# Patient Record
Sex: Female | Born: 1955 | Race: White | Hispanic: No | Marital: Married | Smoking: Never smoker
Health system: Southern US, Community
[De-identification: ages and names within clinical notes are randomized; demographics above are authoritative.]

## PROBLEM LIST (undated history)

## (undated) DIAGNOSIS — F329 Major depressive disorder, single episode, unspecified: Secondary | ICD-10-CM

## (undated) DIAGNOSIS — F419 Anxiety disorder, unspecified: Secondary | ICD-10-CM

## (undated) DIAGNOSIS — R112 Nausea with vomiting, unspecified: Secondary | ICD-10-CM

## (undated) DIAGNOSIS — Z9889 Other specified postprocedural states: Secondary | ICD-10-CM

## (undated) DIAGNOSIS — M797 Fibromyalgia: Secondary | ICD-10-CM

## (undated) DIAGNOSIS — K219 Gastro-esophageal reflux disease without esophagitis: Secondary | ICD-10-CM

## (undated) DIAGNOSIS — F32A Depression, unspecified: Secondary | ICD-10-CM

## (undated) HISTORY — PX: COLPOSCOPY: SHX161

## (undated) HISTORY — PX: SINUS SURGERY WITH INSTATRAK: SHX5215

## (undated) HISTORY — PX: MANDIBLE RECONSTRUCTION: SHX431

## (undated) HISTORY — PX: TUBAL LIGATION: SHX77

---

## 1979-04-01 HISTORY — PX: ABDOMINAL HYSTERECTOMY: SHX81

## 1981-03-31 HISTORY — PX: MANDIBLE SURGERY: SHX707

## 2005-04-22 ENCOUNTER — Ambulatory Visit: Payer: Self-pay

## 2006-04-28 ENCOUNTER — Ambulatory Visit: Payer: Self-pay

## 2006-05-29 ENCOUNTER — Ambulatory Visit: Payer: Self-pay

## 2007-05-26 ENCOUNTER — Ambulatory Visit: Payer: Self-pay

## 2007-06-02 ENCOUNTER — Ambulatory Visit: Payer: Self-pay

## 2009-09-21 ENCOUNTER — Ambulatory Visit: Payer: Self-pay

## 2009-11-30 ENCOUNTER — Ambulatory Visit: Payer: Self-pay | Admitting: Gastroenterology

## 2011-09-14 ENCOUNTER — Ambulatory Visit (INDEPENDENT_AMBULATORY_CARE_PROVIDER_SITE_OTHER): Payer: 59 | Admitting: Emergency Medicine

## 2011-09-14 VITALS — BP 105/69 | HR 80 | Temp 98.1°F | Resp 16 | Ht 68.0 in | Wt 171.0 lb

## 2011-09-14 DIAGNOSIS — R5383 Other fatigue: Secondary | ICD-10-CM

## 2011-09-14 DIAGNOSIS — R5381 Other malaise: Secondary | ICD-10-CM

## 2011-09-14 LAB — POCT CBC
HCT, POC: 38.4 % (ref 37.7–47.9)
Hemoglobin: 11.9 g/dL — AB (ref 12.2–16.2)
MCH, POC: 25.8 pg — AB (ref 27–31.2)
MCV: 83.2 fL (ref 80–97)
MID (cbc): 0.3 (ref 0–0.9)
RBC: 4.61 M/uL (ref 4.04–5.48)
WBC: 5.6 10*3/uL (ref 4.6–10.2)

## 2011-09-14 LAB — COMPREHENSIVE METABOLIC PANEL
ALT: 15 U/L (ref 0–35)
Albumin: 4.6 g/dL (ref 3.5–5.2)
CO2: 30 mEq/L (ref 19–32)
Chloride: 107 mEq/L (ref 96–112)
Glucose, Bld: 87 mg/dL (ref 70–99)
Potassium: 4.9 mEq/L (ref 3.5–5.3)
Sodium: 144 mEq/L (ref 135–145)
Total Protein: 7.1 g/dL (ref 6.0–8.3)

## 2011-09-14 LAB — VITAMIN B12: Vitamin B-12: 461 pg/mL (ref 211–911)

## 2011-09-14 NOTE — Progress Notes (Signed)
  Subjective:    Patient ID: Deborah Walsh, female    DOB: 07/29/1955, 56 y.o.   MRN: [id number]  HPI Comments: The patient is experiencing extreme fatigue for 6 weeks to the point that she feels a need to go to bed when she arrives home from work.Says that she sleeps well and is uninterrupted in her sleep.  Denies acute depression and her symptoms have been under control with zoloft.  No constitutional symptoms or symptoms acute illness.  Says mother has hypothyroidism and she is routinely checked for TSH     Review of Systems  Constitutional: Positive for activity change and fatigue. Negative for fever, chills, diaphoresis, appetite change and unexpected weight change.  HENT: Negative.   Eyes: Negative.   Respiratory: Negative.   Cardiovascular: Negative.   Gastrointestinal: Negative.   Genitourinary: Negative.   Musculoskeletal: Negative.   Neurological: Negative.   Hematological: Negative.   Psychiatric/Behavioral: Negative.        Objective:   Physical Exam  Constitutional: She is oriented to person, place, and time. She appears well-developed and well-nourished.  HENT:  Head: Normocephalic and atraumatic.  Right Ear: External ear normal.  Left Ear: External ear normal.  Eyes: EOM are normal. Pupils are equal, round, and reactive to light. No scleral icterus.  Neck: Normal range of motion. Neck supple.  Cardiovascular: Normal rate, regular rhythm and normal heart sounds.   Pulmonary/Chest: Effort normal and breath sounds normal. She has no wheezes. She has no rales.  Abdominal: Soft. She exhibits no mass. There is no tenderness. There is no rebound and no guarding.  Musculoskeletal: Normal range of motion. She exhibits no edema.  Neurological: She is alert and oriented to person, place, and time. She has normal reflexes.  Skin: Skin is warm and dry.  Psychiatric: She has a normal mood and affect. Her behavior is normal. Judgment and thought content normal.            Assessment & Plan:  Had colonoscopy within past 18 months and was normal.  Will check labs and follow up with patient.

## 2011-09-15 LAB — VITAMIN D 25 HYDROXY (VIT D DEFICIENCY, FRACTURES): Vit D, 25-Hydroxy: 53 ng/mL (ref 30–89)

## 2011-09-17 ENCOUNTER — Ambulatory Visit (INDEPENDENT_AMBULATORY_CARE_PROVIDER_SITE_OTHER): Payer: 59 | Admitting: Family Medicine

## 2011-09-17 ENCOUNTER — Encounter: Payer: Self-pay | Admitting: Radiology

## 2011-09-17 VITALS — BP 130/78 | HR 64 | Temp 97.9°F | Resp 17 | Ht 67.0 in | Wt 173.0 lb

## 2011-09-17 DIAGNOSIS — R5383 Other fatigue: Secondary | ICD-10-CM

## 2011-09-17 DIAGNOSIS — R5381 Other malaise: Secondary | ICD-10-CM

## 2011-09-17 DIAGNOSIS — D649 Anemia, unspecified: Secondary | ICD-10-CM

## 2011-09-17 NOTE — Progress Notes (Signed)
  Subjective:    Patient ID: Deborah Walsh, female    DOB: [birth year], 56 y.o.   MRN: [id number]  HPI patient was told to come back in for a B12 shot, did her B12 level was low. I reviewed all labs with her, and I explained that I do not think that the B12 level is in the low, that this was an error. The B12 level its to me like it is about mid normal range. She does have a mild microcytic anemia, and needs to have an iron level checked. She has had a colonoscopy 1-1/2 years ago which was normal. Her fatigue started about 6 weeks ago. I went through a review of systems again there, and she denies any other complaints. She doesn't know social stresses that might have triggered this. She works at [employer].  She expresses she is very upset about having had to come in and wake when this was not an abnormal lab.    Review of Systems Review of systems done as noted above    Objective:   Physical Exam        Assessment & Plan:  Fatigue Microcytic anemia  Plan: I explained to her that we would not be charging her for today. I am checking her iron. She left very upset about the day.

## 2011-09-27 ENCOUNTER — Telehealth: Payer: Self-pay

## 2011-09-27 NOTE — Telephone Encounter (Signed)
Pt wanted to find to get test results for added on test.

## 2011-09-28 NOTE — Telephone Encounter (Signed)
I called [facility] and spoke with Mindy to find out results on pt's blood. In our system showed as in process. She stated that it was their fault, that they do have the message of where Deborah Walsh called and added on the test, but the test did not get passed to the lab at [facility] and blood has been disposed of.

## 2011-09-29 ENCOUNTER — Telehealth: Payer: Self-pay

## 2011-09-29 NOTE — Telephone Encounter (Signed)
Spoke with pt advised blood was not checked for Iron/TIBC like we wanted [facility] to do. Pt very upset and would like [place] to write a script for Iron/TIBC and mail it to her to take to [employer] to get it done there because she works there. Please write, she wants it mailed

## 2011-09-29 NOTE — Telephone Encounter (Signed)
Done and up front 

## 2011-09-30 NOTE — Telephone Encounter (Signed)
Pt notified and wanted it mailed to her. I am mailing it now.

## 2011-10-02 NOTE — Telephone Encounter (Signed)
The notes are noted.  She has been sent a slip to get iron checked.  Not much else to be done at this point as far as I can tell.

## 2011-12-04 ENCOUNTER — Encounter: Payer: Self-pay | Admitting: Family Medicine

## 2013-03-27 ENCOUNTER — Other Ambulatory Visit: Payer: Self-pay | Admitting: Physician Assistant

## 2013-03-27 MED ORDER — OSELTAMIVIR PHOSPHATE 75 MG PO CAPS: 75.0000 mg | ORAL_CAPSULE | Freq: Two times a day (BID) | ORAL | Status: DC

## 2013-12-27 DIAGNOSIS — F419 Anxiety disorder, unspecified: Secondary | ICD-10-CM | POA: Insufficient documentation

## 2013-12-27 DIAGNOSIS — F32A Depression, unspecified: Secondary | ICD-10-CM | POA: Insufficient documentation

## 2013-12-27 DIAGNOSIS — F329 Major depressive disorder, single episode, unspecified: Secondary | ICD-10-CM | POA: Insufficient documentation

## 2014-01-26 ENCOUNTER — Ambulatory Visit: Payer: Self-pay | Admitting: Family Medicine

## 2015-04-09 ENCOUNTER — Emergency Department
Admission: EM | Admit: 2015-04-09 | Discharge: 2015-04-09 | Disposition: A | Discharge status: Home / Self Care | Payer: 59 | Attending: Emergency Medicine | Admitting: Emergency Medicine

## 2015-04-09 ENCOUNTER — Encounter: Payer: Self-pay | Admitting: Emergency Medicine

## 2015-04-09 DIAGNOSIS — Z79899 Other long term (current) drug therapy: Secondary | ICD-10-CM | POA: Diagnosis not present

## 2015-04-09 DIAGNOSIS — M79604 Pain in right leg: Secondary | ICD-10-CM | POA: Diagnosis present

## 2015-04-09 DIAGNOSIS — M5431 Sciatica, right side: Secondary | ICD-10-CM

## 2015-04-09 HISTORY — DX: Anxiety disorder, unspecified: F41.9

## 2015-04-09 HISTORY — DX: Fibromyalgia: M79.7

## 2015-04-09 MED ORDER — METHOCARBAMOL 750 MG PO TABS: 1500.0000 mg | ORAL_TABLET | Freq: Four times a day (QID) | ORAL | Status: DC

## 2015-04-09 MED ORDER — METHOCARBAMOL 500 MG PO TABS
1000.0000 mg | ORAL_TABLET | Freq: Once | ORAL | Status: AC
  Administered 2015-04-09: 1000 mg via ORAL
  Filled 2015-04-09: qty 2

## 2015-04-09 MED ORDER — OXYCODONE-ACETAMINOPHEN 5-325 MG PO TABS: 1.0000 | ORAL_TABLET | Freq: Four times a day (QID) | ORAL | Status: DC | PRN

## 2015-04-09 MED ORDER — DEXAMETHASONE SODIUM PHOSPHATE 10 MG/ML IJ SOLN
10.0000 mg | Freq: Once | INTRAMUSCULAR | Status: AC
  Administered 2015-04-09: 10 mg via INTRAMUSCULAR
  Filled 2015-04-09: qty 1

## 2015-04-09 MED ORDER — METHYLPREDNISOLONE 4 MG PO TBPK: ORAL_TABLET | ORAL | Status: DC

## 2015-04-09 MED ORDER — HYDROMORPHONE HCL 1 MG/ML IJ SOLN
1.0000 mg | Freq: Once | INTRAMUSCULAR | Status: AC
  Administered 2015-04-09: 1 mg via INTRAMUSCULAR
  Filled 2015-04-09: qty 1

## 2015-04-09 NOTE — ED Notes (Signed)
States she has a hx of sciatica developed pain to lower back which is radiating into  Right leg   Pain started on Friday and is worse today

## 2015-04-09 NOTE — ED Notes (Signed)
Pt to ed with c/o right leg pain and lower back pain x several months,  States last week much worse, denies fall or injury.

## 2015-04-09 NOTE — ED Provider Notes (Signed)
[facility] Emergency Department Provider Note  ____________________________________________  Time seen: Approximately 10:53 AM  I have reviewed the triage vital signs and the nursing notes.   HISTORY  Chief Complaint Leg Pain    HPI Deborah Walsh is a 60 y.o. female patient complaining of 3 days of sciatica affecting the right lower extremity. Patient states she talked to her doctor who prescribed Mobic telephonically.Patient stated this is not relieved her complaint. Patient denies any bladder or bowel dysfunction. Patient rates the pain to 7/10. Patient describes the pain as a radiating sensation resulting in numbness to the right lower extremity. No other palliative measures taken for this complaint.   Past Medical History  Diagnosis Date  . Anxiety   . Fibromyalgia     There are no active problems to display for this patient.   History reviewed. No pertinent past surgical history.  Current Outpatient Rx  Name  Route  Sig  Dispense  Refill  . oseltamivir (TAMIFLU) 75 MG capsule   Oral   Take 1 capsule (75 mg total) by mouth 2 (two) times daily.   10 capsule   0   . sertraline (ZOLOFT) 50 MG tablet   Oral   Take 50 mg by mouth daily.           Allergies Sulfa antibiotics  History reviewed. No pertinent family history.  Social History Social History  Substance Use Topics  . Smoking status: Never Smoker   . Smokeless tobacco: None  . Alcohol Use: No    Review of Systems Constitutional: No fever/chills Eyes: No visual changes. ENT: No sore throat. Cardiovascular: Denies chest pain. Respiratory: Denies shortness of breath. Gastrointestinal: No abdominal pain.  No nausea, no vomiting.  No diarrhea.  No constipation. Genitourinary: Negative for dysuria. Musculoskeletal: Negative for back pain. Skin: Negative for rash. Neurological: Negative for headaches, focal weakness or numbness. Allergic/Immunilogical: Sulfa antibiotics   10-point ROS otherwise negative.  ____________________________________________   PHYSICAL EXAM:  VITAL SIGNS: ED Triage Vitals  Enc Vitals Group     BP 04/09/15 1002 136/80 mmHg     Pulse Rate 04/09/15 1002 87     Resp --      Temp 04/09/15 1002 98 F (36.7 C)     Temp Source 04/09/15 1002 Oral     SpO2 04/09/15 1002 100 %     Weight 04/09/15 1018 173 lb (78.472 kg)     Height 04/09/15 1018 5\' 8"  (1.727 m)     Head Cir --      Peak Flow --      Pain Score 04/09/15 0947 7     Pain Loc --      Pain Edu? --      Excl. in GC? --     Constitutional: Alert and oriented. Well appearing and in no acute distress. Eyes: Conjunctivae are normal. PERRL. EOMI. Head: Atraumatic. Nose: No congestion/rhinnorhea. Mouth/Throat: Mucous membranes are moist.  Oropharynx non-erythematous. Neck: No stridor. No cervical spine tenderness to palpation. Hematological/Lymphatic/Immunilogical: No cervical lymphadenopathy. Cardiovascular: Normal rate, regular rhythm. Grossly normal heart sounds.  Good peripheral circulation. Respiratory: Normal respiratory effort.  No retractions. Lungs CTAB. Gastrointestinal: Soft and nontender. No distention. No abdominal bruits. No CVA tenderness. Musculoskeletal: No lower extremity tenderness nor edema.  No joint effusions. Neurologic:  Normal speech and language. No gross focal neurologic deficits are appreciated. No gait instability. Skin:  Skin is warm, dry and intact. No rash noted. Psychiatric: Mood and affect are normal.  Speech and behavior are normal.  ____________________________________________   LABS (all labs ordered are listed, but only abnormal results are displayed)  Labs Reviewed - No data to display ____________________________________________  EKG   ____________________________________________  RADIOLOGY   ____________________________________________   PROCEDURES  Procedure(s) performed: None  Critical Care performed:  No  ____________________________________________   INITIAL IMPRESSION / ASSESSMENT AND PLAN / ED COURSE  Pertinent labs & imaging results that were available during my care of the patient were reviewed by me and considered in my medical decision making (see chart for details).  Sciatica involving the right lower extremity. Patient given discharge instructions and advised follow-up family doctor in 2-3 days. Return to ER if condition worsens. ____________________________________________   FINAL CLINICAL IMPRESSION(S) / ED DIAGNOSES  Final diagnoses:  Sciatica of right side      Joni Reining, PA-C 04/09/15 1109  Emily Filbert, MD 04/09/15 [phone]

## 2017-01-08 ENCOUNTER — Ambulatory Visit: Payer: Self-pay | Admitting: Advanced Practice Midwife

## 2017-01-19 ENCOUNTER — Ambulatory Visit (INDEPENDENT_AMBULATORY_CARE_PROVIDER_SITE_OTHER): Payer: 59 | Admitting: Advanced Practice Midwife

## 2017-01-19 ENCOUNTER — Encounter: Payer: Self-pay | Admitting: Advanced Practice Midwife

## 2017-01-19 VITALS — BP 120/80 | HR 83 | Ht 67.0 in | Wt 185.0 lb

## 2017-01-19 DIAGNOSIS — Z124 Encounter for screening for malignant neoplasm of cervix: Secondary | ICD-10-CM | POA: Diagnosis not present

## 2017-01-19 DIAGNOSIS — Z01419 Encounter for gynecological examination (general) (routine) without abnormal findings: Secondary | ICD-10-CM

## 2017-01-19 NOTE — Patient Instructions (Signed)
Preventive Care 40-64 Years, Female Preventive care refers to lifestyle choices and visits with your health care provider that can promote health and wellness. What does preventive care include?  A yearly physical exam. This is also called an annual well check.  Dental exams once or twice a year.  Routine eye exams. Ask your health care provider how often you should have your eyes checked.  Personal lifestyle choices, including: ? Daily care of your teeth and gums. ? Regular physical activity. ? Eating a healthy diet. ? Avoiding tobacco and drug use. ? Limiting alcohol use. ? Practicing safe sex. ? Taking low-dose aspirin daily starting at age 58. ? Taking vitamin and mineral supplements as recommended by your health care provider. What happens during an annual well check? The services and screenings done by your health care provider during your annual well check will depend on your age, overall health, lifestyle risk factors, and family history of disease. Counseling Your health care provider may ask you questions about your:  Alcohol use.  Tobacco use.  Drug use.  Emotional well-being.  Home and relationship well-being.  Sexual activity.  Eating habits.  Work and work Statistician.  Method of birth control.  Menstrual cycle.  Pregnancy history.  Screening You may have the following tests or measurements:  Height, weight, and BMI.  Blood pressure.  Lipid and cholesterol levels. These may be checked every 5 years, or more frequently if you are over 81 years old.  Skin check.  Lung cancer screening. You may have this screening every year starting at age 78 if you have a 30-pack-year history of smoking and currently smoke or have quit within the past 15 years.  Fecal occult blood test (FOBT) of the stool. You may have this test every year starting at age 65.  Flexible sigmoidoscopy or colonoscopy. You may have a sigmoidoscopy every 5 years or a colonoscopy  every 10 years starting at age 30.  Hepatitis C blood test.  Hepatitis B blood test.  Sexually transmitted disease (STD) testing.  Diabetes screening. This is done by checking your blood sugar (glucose) after you have not eaten for a while (fasting). You may have this done every 1-3 years.  Mammogram. This may be done every 1-2 years. Talk to your health care provider about when you should start having regular mammograms. This may depend on whether you have a family history of breast cancer.  BRCA-related cancer screening. This may be done if you have a family history of breast, ovarian, tubal, or peritoneal cancers.  Pelvic exam and Pap test. This may be done every 3 years starting at age 80. Starting at age 36, this may be done every 5 years if you have a Pap test in combination with an HPV test.  Bone density scan. This is done to screen for osteoporosis. You may have this scan if you are at high risk for osteoporosis.  Discuss your test results, treatment options, and if necessary, the need for more tests with your health care provider. Vaccines Your health care provider may recommend certain vaccines, such as:  Influenza vaccine. This is recommended every year.  Tetanus, diphtheria, and acellular pertussis (Tdap, Td) vaccine. You may need a Td booster every 10 years.  Varicella vaccine. You may need this if you have not been vaccinated.  Zoster vaccine. You may need this after age 5.  Measles, mumps, and rubella (MMR) vaccine. You may need at least one dose of MMR if you were born in  1957 or later. You may also need a second dose.  Pneumococcal 13-valent conjugate (PCV13) vaccine. You may need this if you have certain conditions and were not previously vaccinated.  Pneumococcal polysaccharide (PPSV23) vaccine. You may need one or two doses if you smoke cigarettes or if you have certain conditions.  Meningococcal vaccine. You may need this if you have certain  conditions.  Hepatitis A vaccine. You may need this if you have certain conditions or if you travel or work in places where you may be exposed to hepatitis A.  Hepatitis B vaccine. You may need this if you have certain conditions or if you travel or work in places where you may be exposed to hepatitis B.  Haemophilus influenzae type b (Hib) vaccine. You may need this if you have certain conditions.  Talk to your health care provider about which screenings and vaccines you need and how often you need them. This information is not intended to replace advice given to you by your health care provider. Make sure you discuss any questions you have with your health care provider. Document Released: 04/13/2015 Document Revised: 12/05/2015 Document Reviewed: 01/16/2015 Elsevier Interactive Patient Education  2017 [employer].

## 2017-01-19 NOTE — Progress Notes (Signed)
Patient ID: Deborah Walsh, female   DOB: [birth year], 61 y.o.   MRN: [id number]     Gynecology Annual Exam  PCP: Towana Badger, MD  Chief Complaint:  Chief Complaint  Patient presents with  . Gynecologic Exam    History of Present Illness:Patient is a 61 y.o. G2P1011 presents for annual exam. The patient has no complaints today.   LMP: No LMP recorded. Patient has had a hysterectomy. Patient is postmenopausal. Menarche:not applicable Postcoital Bleeding: no Dysmenorrhea: not applicable   The patient is sexually active. She denies dyspareunia.  The patient does not perform self breast exams.  There is no notable family history of breast or ovarian cancer in her family.  The patient wears seatbelts: yes.   The patient has regular exercise: yes.    The patient denies current symptoms of depression.  She admits to correct dosage of, and management of her antidepressant.  Review of Systems: Review of Systems  Constitutional: Negative.   HENT: Negative.   Eyes: Negative.   Respiratory: Negative.   Cardiovascular: Negative.   Gastrointestinal: Negative.   Genitourinary: Negative.   Musculoskeletal: Negative.   Skin: Negative.   Neurological: Negative.   Endo/Heme/Allergies: Negative.   Psychiatric/Behavioral: Negative.     Past Medical History:  Past Medical History:  Diagnosis Date  . Anxiety   . Fibromyalgia     Past Surgical History:  Past Surgical History:  Procedure Laterality Date  . ABDOMINAL HYSTERECTOMY    . COLPOSCOPY    . SINUS SURGERY WITH INSTATRAK    . TUBAL LIGATION      Gynecologic History:  No LMP recorded. Patient has had a hysterectomy. Last Pap: 2 years ago Results were:  ASCUS with NEGATIVE high risk HPV  Last mammogram: 2 years ago Results were: BI-RAD I,  She has had a normal Bone Density Scan in the past Obstetric History: G2P1011  Family History:  Family History  Problem Relation Age of Onset  . Cervical cancer Mother   .  Diabetes Mother   . Hypertension Mother   . Hypothyroidism Mother   . Kidney failure Mother   . Hypertension Father   . Liver cancer Father   . Lung cancer Father   . Parkinson's disease Father     Social History:  Social History   Social History  . Marital status: Married    Spouse name: N/A  . Number of children: N/A  . Years of education: N/A   Occupational History  . Not on file.   Social History Main Topics  . Smoking status: Never Smoker  . Smokeless tobacco: Never Used  . Alcohol use No  . Drug use: No  . Sexual activity: Yes   Other Topics Concern  . Not on file   Social History Narrative  . No narrative on file    Allergies:  Allergies  Allergen Reactions  . Sulfa Antibiotics     Medications: Prior to Admission medications   Medication Sig Start Date End Date Taking? Authorizing Provider  nortriptyline (PAMELOR) 50 MG capsule  11/18/16   [provider]  sertraline (ZOLOFT) 50 MG tablet Take 50 mg by mouth daily.    [provider]    Physical Exam Vitals: Blood pressure 120/80, pulse 83, height 5\' 7"  (1.702 m), weight 185 lb (83.9 kg).  General: NAD HEENT: normocephalic, anicteric Thyroid: no enlargement, no palpable nodules Pulmonary: No increased work of breathing, CTAB Cardiovascular: RRR, distal pulses 2+ Breast: Breast symmetrical, no  tenderness, no palpable nodules or masses, no skin or nipple retraction present, no nipple discharge.  No axillary or supraclavicular lymphadenopathy. Abdomen: NABS, soft, non-tender, non-distended.  Umbilicus without lesions.  No hepatomegaly, splenomegaly or masses palpable. No evidence of hernia  Genitourinary:  External: Normal external female genitalia.  Normal urethral meatus, normal  Bartholin's and Skene's glands.    Vagina: Normal vaginal mucosa, no evidence of prolapse.    Cervix: s/p hysterectomy  Uterus: s/p hysterectomy  Adnexa: ovaries non-enlarged, no adnexal masses  Rectal:  deferred  Lymphatic: no evidence of inguinal lymphadenopathy Extremities: no edema, erythema, or tenderness Neurologic: Grossly intact Psychiatric: mood appropriate, affect full    Assessment: 61 y.o. G2P1011 routine annual exam  Plan: Problem List Items Addressed This Visit    None    Visit Diagnoses    Well woman exam with routine gynecological exam    -  Primary   Relevant Orders   IGP, Aptima HPV   MM DIGITAL SCREENING BILATERAL   DG Bone Density   Cervical cancer screening       Relevant Orders   IGP, Aptima HPV      1) Mammogram - recommend yearly screening mammogram.  Mammogram Was ordered today  2) STI screening was offered and declined  3) ASCCP guidelines and rational discussed.  Patient opts for determining screening interval following results of today's PAP  4) Osteoporosis  - per USPTF routine screening DEXA at age 63, Scan ordered today   Consider FDA-approved medical therapies in postmenopausal women and men aged 63 years and older, based on the following: a) A hip or vertebral (clinical or morphometric) fracture b) T-score ? -2.5 at the femoral neck or spine after appropriate evaluation to exclude secondary causes C) Low bone mass (T-score between -1.0 and -2.5 at the femoral neck or spine) and a 10-year probability of a hip fracture ? 3% or a 10-year probability of a major osteoporosis-related fracture ? 20% based on the [place]-adapted WHO algorithm   5) Routine healthcare maintenance including cholesterol, diabetes screening discussed managed by PCP  6) Colonoscopy: managed by PCP.  Screening recommended starting at age 63 for average risk individuals, age 640 for individuals deemed at increased risk (including African Americans) and recommended to continue until age 175.  For patient age 63-85 individualized approach is recommended.  Gold standard screening is via colonoscopy, Cologuard screening is an acceptable alternative for patient unwilling or unable to  undergo colonoscopy.  "Colorectal cancer screening for average?risk adults: 2018 guideline update from the American Cancer Society"CA: A Cancer Journal for Clinicians: Aug 27, 2016   7) Follow up 1 year for routine annual  Tresea MallJane Scheryl Sanborn, [place]

## 2017-01-21 LAB — IGP, APTIMA HPV
HPV APTIMA: NEGATIVE
PAP SMEAR COMMENT: 0

## 2017-03-11 ENCOUNTER — Other Ambulatory Visit: Payer: Self-pay | Admitting: Physical Medicine and Rehabilitation

## 2017-03-11 DIAGNOSIS — M5412 Radiculopathy, cervical region: Secondary | ICD-10-CM

## 2017-03-18 ENCOUNTER — Ambulatory Visit: Payer: 59

## 2017-03-25 ENCOUNTER — Ambulatory Visit
Admission: RE | Admit: 2017-03-25 | Discharge: 2017-03-25 | Disposition: A | Discharge status: Home / Self Care | Payer: 59 | Source: Ambulatory Visit | Attending: Physical Medicine and Rehabilitation | Admitting: Physical Medicine and Rehabilitation

## 2017-03-25 DIAGNOSIS — M5412 Radiculopathy, cervical region: Secondary | ICD-10-CM | POA: Diagnosis present

## 2017-03-25 DIAGNOSIS — M47814 Spondylosis without myelopathy or radiculopathy, thoracic region: Secondary | ICD-10-CM | POA: Insufficient documentation

## 2017-03-25 DIAGNOSIS — M4322 Fusion of spine, cervical region: Secondary | ICD-10-CM | POA: Diagnosis not present

## 2017-04-22 ENCOUNTER — Other Ambulatory Visit: Payer: 59

## 2017-07-06 ENCOUNTER — Ambulatory Visit
Admission: RE | Admit: 2017-07-06 | Discharge: 2017-07-06 | Disposition: A | Discharge status: Home / Self Care | Payer: Managed Care, Other (non HMO) | Source: Ambulatory Visit | Attending: Advanced Practice Midwife | Admitting: Advanced Practice Midwife

## 2017-07-06 DIAGNOSIS — M8589 Other specified disorders of bone density and structure, multiple sites: Secondary | ICD-10-CM | POA: Insufficient documentation

## 2017-07-06 DIAGNOSIS — Z1231 Encounter for screening mammogram for malignant neoplasm of breast: Secondary | ICD-10-CM | POA: Insufficient documentation

## 2017-07-06 DIAGNOSIS — Z01419 Encounter for gynecological examination (general) (routine) without abnormal findings: Secondary | ICD-10-CM

## 2017-07-07 ENCOUNTER — Other Ambulatory Visit (HOSPITAL_COMMUNITY): Payer: Self-pay | Admitting: Orthopedic Surgery

## 2017-07-07 ENCOUNTER — Other Ambulatory Visit: Payer: Self-pay | Admitting: Orthopedic Surgery

## 2017-07-07 DIAGNOSIS — M25511 Pain in right shoulder: Secondary | ICD-10-CM

## 2017-07-14 ENCOUNTER — Ambulatory Visit: Payer: Managed Care, Other (non HMO)

## 2017-07-14 ENCOUNTER — Ambulatory Visit
Admission: RE | Admit: 2017-07-14 | Discharge: 2017-07-14 | Disposition: A | Discharge status: Home / Self Care | Payer: Managed Care, Other (non HMO) | Source: Ambulatory Visit | Attending: Orthopedic Surgery | Admitting: Orthopedic Surgery

## 2017-07-14 DIAGNOSIS — M25511 Pain in right shoulder: Secondary | ICD-10-CM

## 2017-07-14 DIAGNOSIS — M19011 Primary osteoarthritis, right shoulder: Secondary | ICD-10-CM | POA: Diagnosis not present

## 2017-08-04 ENCOUNTER — Other Ambulatory Visit: Payer: Self-pay

## 2017-08-04 ENCOUNTER — Encounter
Admission: RE | Admit: 2017-08-04 | Discharge: 2017-08-04 | Disposition: A | Discharge status: Home / Self Care | Payer: Managed Care, Other (non HMO) | Source: Ambulatory Visit | Attending: Orthopedic Surgery | Admitting: Orthopedic Surgery

## 2017-08-04 ENCOUNTER — Inpatient Hospital Stay: Admission: RE | Admit: 2017-08-04 | Payer: Managed Care, Other (non HMO) | Source: Ambulatory Visit

## 2017-08-04 HISTORY — DX: Gastro-esophageal reflux disease without esophagitis: K21.9

## 2017-08-04 HISTORY — DX: Major depressive disorder, single episode, unspecified: F32.9

## 2017-08-04 HISTORY — DX: Depression, unspecified: F32.A

## 2017-08-04 HISTORY — DX: Other specified postprocedural states: Z98.890

## 2017-08-04 HISTORY — DX: Nausea with vomiting, unspecified: R11.2

## 2017-08-04 LAB — CBC
HCT: 31.3 % — ABNORMAL LOW (ref 35.0–47.0)
HEMOGLOBIN: 10.5 g/dL — AB (ref 12.0–16.0)
MCH: 26.7 pg (ref 26.0–34.0)
MCHC: 33.6 g/dL (ref 32.0–36.0)
MCV: 79.5 fL — ABNORMAL LOW (ref 80.0–100.0)
Platelets: 296 10*3/uL (ref 150–440)
RBC: 3.94 MIL/uL (ref 3.80–5.20)
RDW: 15.9 % — AB (ref 11.5–14.5)
WBC: 6.1 10*3/uL (ref 3.6–11.0)

## 2017-08-04 LAB — URINALYSIS, ROUTINE W REFLEX MICROSCOPIC
Bilirubin Urine: NEGATIVE
GLUCOSE, UA: NEGATIVE mg/dL
HGB URINE DIPSTICK: NEGATIVE
KETONES UR: NEGATIVE mg/dL
LEUKOCYTES UA: NEGATIVE
Nitrite: NEGATIVE
PH: 7 (ref 5.0–8.0)
Protein, ur: NEGATIVE mg/dL
Specific Gravity, Urine: 1.008 (ref 1.005–1.030)

## 2017-08-04 LAB — BASIC METABOLIC PANEL
Anion gap: 7 (ref 5–15)
BUN: 16 mg/dL (ref 6–20)
CALCIUM: 8.8 mg/dL — AB (ref 8.9–10.3)
CHLORIDE: 102 mmol/L (ref 101–111)
CO2: 27 mmol/L (ref 22–32)
CREATININE: 1.03 mg/dL — AB (ref 0.44–1.00)
GFR calc Af Amer: >60 mL/min (ref >60)
GFR calc non Af Amer: 57 mL/min — ABNORMAL LOW (ref >60)
Glucose, Bld: 90 mg/dL (ref 65–99)
Potassium: 4.1 mmol/L (ref 3.5–5.1)
SODIUM: 136 mmol/L (ref 135–145)

## 2017-08-04 LAB — TYPE AND SCREEN
ABO/RH(D): A POS
Antibody Screen: NEGATIVE

## 2017-08-04 LAB — SURGICAL PCR SCREEN
MRSA, PCR: NEGATIVE
Staphylococcus aureus: NEGATIVE

## 2017-08-04 LAB — PROTIME-INR
INR: 0.92
PROTHROMBIN TIME: 12.3 s (ref 11.4–15.2)

## 2017-08-04 NOTE — Patient Instructions (Signed)
Your procedure is scheduled on:  Friday, Aug 07, 2017 Report to Day Surgery on the 2nd floor of the [facility] To find out your arrival time, please call [phone] between 1PM - 3PM on: Thursday, Aug 06, 2017  REMEMBER: Instructions that are not followed completely may result in serious medical risk, up to and including death; or upon the discretion of your surgeon and anesthesiologist your surgery may need to be rescheduled.  Do not eat food after midnight the night before your procedure.  No gum chewing, lozengers or hard candies.  You may however, drink CLEAR liquids up to 2 hours before you are scheduled to arrive for your surgery. Do not drink anything within 2 hours of the start of your surgery.  Clear liquids include: - water  - apple juice without pulp - clear gatorade - black coffee or tea (Do NOT add anything to the coffee or tea) Do NOT drink anything that is not on this list.  No Alcohol for 24 hours before or after surgery.  No Smoking including e-cigarettes for 24 hours prior to surgery.  No chewable tobacco products for at least 6 hours prior to surgery.  No nicotine patches on the day of surgery.  On the morning of surgery brush your teeth with toothpaste and water, you may rinse your mouth with mouthwash if you wish. Do not swallow any toothpaste or mouthwash.  Notify your doctor if there is any change in your medical condition (cold, fever, infection).  Do not wear jewelry, make-up, hairpins, clips or nail polish.  Do not wear lotions, powders, or perfumes. You may wear deodorant.  Do not shave 48 hours prior to surgery.   Contacts and dentures may not be worn into surgery.  Do not bring valuables to the hospital, including drivers license, insurance or credit cards.  [facility] is not responsible for any belongings or valuables.   TAKE THESE MEDICATIONS THE MORNING OF SURGERY:  1.  zoloft  Use CHG Soap as directed on instruction sheet.  NOW!   Stop Anti-inflammatories (NSAIDS) such as Advil, Aleve, Ibuprofen, Motrin, Naproxen, Naprosyn and Aspirin based products such as Excedrin, Goodys Powder, BC Powder. (May take Tylenol or Acetaminophen if needed.)  NOW!  Stop ANY OVER THE COUNTER supplements until after surgery. (GLUCOSAMINE)  Wear comfortable clothing (specific to your surgery type) to the hospital.  Plan for stool softeners for home use.  If you are being admitted to the hospital overnight, leave your suitcase in the car. After surgery it may be brought to your room.  Please call [phone] if you have any questions about these instructions.

## 2017-08-05 LAB — URINE CULTURE

## 2017-08-06 MED ORDER — CEFAZOLIN SODIUM-DEXTROSE 2-4 GM/100ML-% IV SOLN
2.0000 g | Freq: Once | INTRAVENOUS | Status: AC
  Administered 2017-08-07 (×2): 2 g via INTRAVENOUS

## 2017-08-07 ENCOUNTER — Other Ambulatory Visit: Payer: Self-pay

## 2017-08-07 ENCOUNTER — Inpatient Hospital Stay: Payer: Managed Care, Other (non HMO)

## 2017-08-07 ENCOUNTER — Inpatient Hospital Stay
Admission: RE | Admit: 2017-08-07 | Discharge: 2017-08-08 | DRG: 483 | Disposition: A | Discharge status: Home / Self Care | Payer: Managed Care, Other (non HMO) | Source: Ambulatory Visit | Attending: Orthopedic Surgery | Admitting: Orthopedic Surgery

## 2017-08-07 ENCOUNTER — Inpatient Hospital Stay: Payer: Managed Care, Other (non HMO) | Admitting: Certified Registered Nurse Anesthetist

## 2017-08-07 ENCOUNTER — Encounter
Admission: RE | Disposition: A | Discharge status: Home / Self Care | Payer: Self-pay | Source: Ambulatory Visit | Attending: Orthopedic Surgery

## 2017-08-07 DIAGNOSIS — Z7982 Long term (current) use of aspirin: Secondary | ICD-10-CM

## 2017-08-07 DIAGNOSIS — F329 Major depressive disorder, single episode, unspecified: Secondary | ICD-10-CM | POA: Diagnosis present

## 2017-08-07 DIAGNOSIS — M19011 Primary osteoarthritis, right shoulder: Secondary | ICD-10-CM | POA: Diagnosis present

## 2017-08-07 DIAGNOSIS — M797 Fibromyalgia: Secondary | ICD-10-CM | POA: Diagnosis present

## 2017-08-07 DIAGNOSIS — K219 Gastro-esophageal reflux disease without esophagitis: Secondary | ICD-10-CM | POA: Diagnosis present

## 2017-08-07 DIAGNOSIS — F419 Anxiety disorder, unspecified: Secondary | ICD-10-CM | POA: Diagnosis present

## 2017-08-07 DIAGNOSIS — M5430 Sciatica, unspecified side: Secondary | ICD-10-CM | POA: Diagnosis not present

## 2017-08-07 DIAGNOSIS — Z96619 Presence of unspecified artificial shoulder joint: Secondary | ICD-10-CM

## 2017-08-07 DIAGNOSIS — Z79899 Other long term (current) drug therapy: Secondary | ICD-10-CM | POA: Diagnosis not present

## 2017-08-07 HISTORY — PX: TOTAL SHOULDER ARTHROPLASTY: SHX126

## 2017-08-07 LAB — ABO/RH: ABO/RH(D): A POS

## 2017-08-07 SURGERY — ARTHROPLASTY, SHOULDER, TOTAL
Anesthesia: General | Site: Shoulder | Laterality: Right | Wound class: Clean

## 2017-08-07 MED ORDER — BUPIVACAINE LIPOSOME 1.3 % IJ SUSP
INTRAMUSCULAR | Status: DC | PRN
  Administered 2017-08-07: 20 mL via PERINEURAL

## 2017-08-07 MED ORDER — PROMETHAZINE HCL 25 MG/ML IJ SOLN: 6.2500 mg | INTRAMUSCULAR | Status: DC | PRN

## 2017-08-07 MED ORDER — BUPIVACAINE HCL (PF) 0.5 % IJ SOLN
INTRAMUSCULAR | Status: DC | PRN
  Administered 2017-08-07: 10 mL via PERINEURAL

## 2017-08-07 MED ORDER — PROPOFOL 500 MG/50ML IV EMUL
INTRAVENOUS | Status: AC
Start: 2017-08-07 — End: ?
  Filled 2017-08-07: qty 50

## 2017-08-07 MED ORDER — LIDOCAINE HCL (CARDIAC) PF 100 MG/5ML IV SOSY
PREFILLED_SYRINGE | INTRAVENOUS | Status: DC | PRN
  Administered 2017-08-07: 100 mg via INTRAVENOUS

## 2017-08-07 MED ORDER — DIPHENHYDRAMINE HCL 12.5 MG/5ML PO ELIX: 12.5000 mg | ORAL_SOLUTION | ORAL | Status: DC | PRN

## 2017-08-07 MED ORDER — SODIUM CHLORIDE 0.9 % IV SOLN
INTRAVENOUS | Status: DC
  Administered 2017-08-07 – 2017-08-08 (×2): via INTRAVENOUS

## 2017-08-07 MED ORDER — BUPIVACAINE HCL (PF) 0.5 % IJ SOLN
INTRAMUSCULAR | Status: AC
  Filled 2017-08-07: qty 10

## 2017-08-07 MED ORDER — FENTANYL CITRATE (PF) 100 MCG/2ML IJ SOLN
50.0000 ug | Freq: Once | INTRAMUSCULAR | Status: AC
  Administered 2017-08-07: 50 ug via INTRAVENOUS

## 2017-08-07 MED ORDER — ONDANSETRON HCL 4 MG PO TABS: 4.0000 mg | ORAL_TABLET | Freq: Four times a day (QID) | ORAL | Status: DC | PRN

## 2017-08-07 MED ORDER — METOCLOPRAMIDE HCL 10 MG PO TABS: 5.0000 mg | ORAL_TABLET | Freq: Three times a day (TID) | ORAL | Status: DC | PRN

## 2017-08-07 MED ORDER — FENTANYL CITRATE (PF) 100 MCG/2ML IJ SOLN
INTRAMUSCULAR | Status: AC
  Administered 2017-08-07: 25 ug via INTRAVENOUS
  Filled 2017-08-07: qty 2

## 2017-08-07 MED ORDER — FENTANYL CITRATE (PF) 100 MCG/2ML IJ SOLN
INTRAMUSCULAR | Status: DC | PRN
  Administered 2017-08-07: 25 ug via INTRAVENOUS
  Administered 2017-08-07 (×4): 50 ug via INTRAVENOUS
  Administered 2017-08-07: 25 ug via INTRAVENOUS

## 2017-08-07 MED ORDER — THROMBIN 5000 UNITS EX SOLR
CUTANEOUS | Status: AC
  Filled 2017-08-07: qty 5000

## 2017-08-07 MED ORDER — METHOCARBAMOL 1000 MG/10ML IJ SOLN
500.0000 mg | Freq: Four times a day (QID) | INTRAVENOUS | Status: DC | PRN
  Filled 2017-08-07: qty 5

## 2017-08-07 MED ORDER — BUPIVACAINE LIPOSOME 1.3 % IJ SUSP
INTRAMUSCULAR | Status: AC
  Filled 2017-08-07: qty 20

## 2017-08-07 MED ORDER — LIDOCAINE HCL (PF) 1 % IJ SOLN
INTRAMUSCULAR | Status: DC | PRN
  Administered 2017-08-07: 5 mL via SUBCUTANEOUS

## 2017-08-07 MED ORDER — ALUM & MAG HYDROXIDE-SIMETH 200-200-20 MG/5ML PO SUSP: 30.0000 mL | ORAL | Status: DC | PRN

## 2017-08-07 MED ORDER — PROPOFOL 10 MG/ML IV BOLUS
INTRAVENOUS | Status: AC
  Filled 2017-08-07: qty 20

## 2017-08-07 MED ORDER — SODIUM CHLORIDE 0.9 % IJ SOLN
INTRAMUSCULAR | Status: AC
  Filled 2017-08-07: qty 20

## 2017-08-07 MED ORDER — TRANEXAMIC ACID 1000 MG/10ML IV SOLN
INTRAVENOUS | Status: DC | PRN
  Administered 2017-08-07: 1000 mg via INTRAVENOUS

## 2017-08-07 MED ORDER — PROPOFOL 10 MG/ML IV BOLUS
INTRAVENOUS | Status: DC | PRN
  Administered 2017-08-07: 150 mg via INTRAVENOUS
  Administered 2017-08-07: 50 mg via INTRAVENOUS

## 2017-08-07 MED ORDER — ROCURONIUM BROMIDE 100 MG/10ML IV SOLN
INTRAVENOUS | Status: DC | PRN
  Administered 2017-08-07: 50 mg via INTRAVENOUS
  Administered 2017-08-07: 20 mg via INTRAVENOUS
  Administered 2017-08-07 (×5): 10 mg via INTRAVENOUS

## 2017-08-07 MED ORDER — SENNOSIDES-DOCUSATE SODIUM 8.6-50 MG PO TABS: 1.0000 | ORAL_TABLET | Freq: Every evening | ORAL | Status: DC | PRN

## 2017-08-07 MED ORDER — ACETAMINOPHEN 10 MG/ML IV SOLN
INTRAVENOUS | Status: DC | PRN
  Administered 2017-08-07: 1000 mg via INTRAVENOUS

## 2017-08-07 MED ORDER — VANCOMYCIN HCL 1000 MG IV SOLR
INTRAVENOUS | Status: AC
  Filled 2017-08-07: qty 1000

## 2017-08-07 MED ORDER — EPHEDRINE SULFATE 50 MG/ML IJ SOLN
INTRAMUSCULAR | Status: AC
  Filled 2017-08-07: qty 1

## 2017-08-07 MED ORDER — FENTANYL CITRATE (PF) 100 MCG/2ML IJ SOLN
INTRAMUSCULAR | Status: AC
  Filled 2017-08-07: qty 2

## 2017-08-07 MED ORDER — ACETAMINOPHEN 10 MG/ML IV SOLN
INTRAVENOUS | Status: AC
  Filled 2017-08-07: qty 100

## 2017-08-07 MED ORDER — ASPIRIN EC 325 MG PO TBEC
325.0000 mg | DELAYED_RELEASE_TABLET | Freq: Every day | ORAL | Status: DC
  Administered 2017-08-08: 325 mg via ORAL
  Filled 2017-08-07: qty 1

## 2017-08-07 MED ORDER — NORTRIPTYLINE HCL 25 MG PO CAPS
50.0000 mg | ORAL_CAPSULE | Freq: Every day | ORAL | Status: DC
  Administered 2017-08-07: 50 mg via ORAL
  Filled 2017-08-07 (×2): qty 2

## 2017-08-07 MED ORDER — PHENYLEPHRINE HCL 10 MG/ML IJ SOLN
INTRAMUSCULAR | Status: DC | PRN
  Administered 2017-08-07: 150 ug via INTRAVENOUS
  Administered 2017-08-07: 100 ug via INTRAVENOUS
  Administered 2017-08-07: 150 ug via INTRAVENOUS
  Administered 2017-08-07: 100 ug via INTRAVENOUS
  Administered 2017-08-07: 200 ug via INTRAVENOUS

## 2017-08-07 MED ORDER — ACETAMINOPHEN 500 MG PO TABS
1000.0000 mg | ORAL_TABLET | Freq: Three times a day (TID) | ORAL | Status: DC
  Administered 2017-08-07 – 2017-08-08 (×2): 1000 mg via ORAL
  Filled 2017-08-07 (×2): qty 2

## 2017-08-07 MED ORDER — LACTATED RINGERS IV SOLN
INTRAVENOUS | Status: DC
  Administered 2017-08-07: 11:00:00 via INTRAVENOUS
  Administered 2017-08-07: 1000 mL via INTRAVENOUS

## 2017-08-07 MED ORDER — SERTRALINE HCL 50 MG PO TABS
150.0000 mg | ORAL_TABLET | Freq: Every day | ORAL | Status: DC
  Administered 2017-08-08: 150 mg via ORAL
  Filled 2017-08-07 (×2): qty 3

## 2017-08-07 MED ORDER — ONDANSETRON HCL 4 MG/2ML IJ SOLN
INTRAMUSCULAR | Status: DC | PRN
  Administered 2017-08-07: 4 mg via INTRAVENOUS

## 2017-08-07 MED ORDER — PHENYLEPHRINE HCL 10 MG/ML IJ SOLN
INTRAMUSCULAR | Status: DC | PRN
  Administered 2017-08-07: 100 ug/min via INTRAVENOUS
  Administered 2017-08-07: 50 ug/min via INTRAVENOUS

## 2017-08-07 MED ORDER — DEXAMETHASONE SODIUM PHOSPHATE 10 MG/ML IJ SOLN
INTRAMUSCULAR | Status: DC | PRN
  Administered 2017-08-07: 8 mg via INTRAVENOUS

## 2017-08-07 MED ORDER — MIDAZOLAM HCL 2 MG/2ML IJ SOLN
1.0000 mg | Freq: Once | INTRAMUSCULAR | Status: AC
  Administered 2017-08-07: 1 mg via INTRAVENOUS

## 2017-08-07 MED ORDER — CEFAZOLIN SODIUM-DEXTROSE 2-4 GM/100ML-% IV SOLN
2.0000 g | Freq: Four times a day (QID) | INTRAVENOUS | Status: AC
  Administered 2017-08-07 – 2017-08-08 (×3): 2 g via INTRAVENOUS
  Filled 2017-08-07 (×3): qty 100

## 2017-08-07 MED ORDER — HYDROMORPHONE HCL 1 MG/ML IJ SOLN: 0.5000 mg | INTRAMUSCULAR | Status: DC | PRN

## 2017-08-07 MED ORDER — DEXAMETHASONE SODIUM PHOSPHATE 10 MG/ML IJ SOLN
INTRAMUSCULAR | Status: AC
  Filled 2017-08-07: qty 1

## 2017-08-07 MED ORDER — NEOMYCIN-POLYMYXIN B GU 40-200000 IR SOLN
Status: AC
  Filled 2017-08-07: qty 20

## 2017-08-07 MED ORDER — VANCOMYCIN HCL 1000 MG IV SOLR
INTRAVENOUS | Status: DC | PRN
  Administered 2017-08-07: 1000 mg

## 2017-08-07 MED ORDER — DOCUSATE SODIUM 100 MG PO CAPS
100.0000 mg | ORAL_CAPSULE | Freq: Two times a day (BID) | ORAL | Status: DC
  Administered 2017-08-07 – 2017-08-08 (×2): 100 mg via ORAL
  Filled 2017-08-07 (×2): qty 1

## 2017-08-07 MED ORDER — MIDAZOLAM HCL 2 MG/2ML IJ SOLN
INTRAMUSCULAR | Status: AC
  Administered 2017-08-07: 1 mg via INTRAVENOUS
  Filled 2017-08-07: qty 2

## 2017-08-07 MED ORDER — METOCLOPRAMIDE HCL 5 MG/ML IJ SOLN: 5.0000 mg | Freq: Three times a day (TID) | INTRAMUSCULAR | Status: DC | PRN

## 2017-08-07 MED ORDER — PHENYLEPHRINE HCL 10 MG/ML IJ SOLN
INTRAMUSCULAR | Status: AC
  Filled 2017-08-07: qty 1

## 2017-08-07 MED ORDER — FAMOTIDINE 20 MG PO TABS
ORAL_TABLET | ORAL | Status: AC
  Administered 2017-08-07: 20 mg via ORAL
  Filled 2017-08-07: qty 1

## 2017-08-07 MED ORDER — METHOCARBAMOL 500 MG PO TABS
500.0000 mg | ORAL_TABLET | Freq: Four times a day (QID) | ORAL | Status: DC | PRN
  Administered 2017-08-07: 500 mg via ORAL
  Filled 2017-08-07: qty 1

## 2017-08-07 MED ORDER — TRANEXAMIC ACID 1000 MG/10ML IV SOLN: INTRAVENOUS | Status: DC | PRN

## 2017-08-07 MED ORDER — ROCURONIUM BROMIDE 50 MG/5ML IV SOLN
INTRAVENOUS | Status: AC
  Filled 2017-08-07: qty 2

## 2017-08-07 MED ORDER — FAMOTIDINE 20 MG PO TABS
20.0000 mg | ORAL_TABLET | Freq: Once | ORAL | Status: AC
  Administered 2017-08-07: 20 mg via ORAL

## 2017-08-07 MED ORDER — TRANEXAMIC ACID 1000 MG/10ML IV SOLN
1000.0000 mg | Freq: Once | INTRAVENOUS | Status: AC
  Administered 2017-08-07: 1000 mg via INTRAVENOUS
  Filled 2017-08-07: qty 10

## 2017-08-07 MED ORDER — NEOMYCIN-POLYMYXIN B GU 40-200000 IR SOLN
Status: DC | PRN
  Administered 2017-08-07: 16 mL

## 2017-08-07 MED ORDER — PROPOFOL 10 MG/ML IV BOLUS
INTRAVENOUS | Status: AC
  Filled 2017-08-07: qty 60

## 2017-08-07 MED ORDER — ONDANSETRON HCL 4 MG/2ML IJ SOLN
INTRAMUSCULAR | Status: AC
  Filled 2017-08-07: qty 2

## 2017-08-07 MED ORDER — FENTANYL CITRATE (PF) 250 MCG/5ML IJ SOLN
INTRAMUSCULAR | Status: AC
  Filled 2017-08-07: qty 5

## 2017-08-07 MED ORDER — BUPIVACAINE-EPINEPHRINE (PF) 0.25% -1:200000 IJ SOLN
INTRAMUSCULAR | Status: AC
  Filled 2017-08-07: qty 30

## 2017-08-07 MED ORDER — PHENYLEPHRINE HCL 10 MG/ML IJ SOLN
INTRAMUSCULAR | Status: AC
  Filled 2017-08-07: qty 2

## 2017-08-07 MED ORDER — THROMBIN 20000 UNITS EX KIT
PACK | CUTANEOUS | Status: AC
  Filled 2017-08-07: qty 1

## 2017-08-07 MED ORDER — FENTANYL CITRATE (PF) 100 MCG/2ML IJ SOLN
INTRAMUSCULAR | Status: AC
  Administered 2017-08-07: 50 ug via INTRAVENOUS
  Filled 2017-08-07: qty 2

## 2017-08-07 MED ORDER — FENTANYL CITRATE (PF) 100 MCG/2ML IJ SOLN
25.0000 ug | INTRAMUSCULAR | Status: DC | PRN
  Administered 2017-08-07 (×2): 25 ug via INTRAVENOUS

## 2017-08-07 MED ORDER — CEFAZOLIN SODIUM-DEXTROSE 2-4 GM/100ML-% IV SOLN
INTRAVENOUS | Status: AC
  Filled 2017-08-07: qty 100

## 2017-08-07 MED ORDER — ONDANSETRON HCL 4 MG/2ML IJ SOLN: 4.0000 mg | Freq: Four times a day (QID) | INTRAMUSCULAR | Status: DC | PRN

## 2017-08-07 MED ORDER — LIDOCAINE HCL (PF) 2 % IJ SOLN
INTRAMUSCULAR | Status: AC
  Filled 2017-08-07: qty 10

## 2017-08-07 MED ORDER — MENTHOL 3 MG MT LOZG: 1.0000 | LOZENGE | OROMUCOSAL | Status: DC | PRN

## 2017-08-07 MED ORDER — TRANEXAMIC ACID 1000 MG/10ML IV SOLN
1000.0000 mg | INTRAVENOUS | Status: DC
  Filled 2017-08-07: qty 10

## 2017-08-07 MED ORDER — PHENOL 1.4 % MT LIQD: 1.0000 | OROMUCOSAL | Status: DC | PRN

## 2017-08-07 MED ORDER — LIDOCAINE HCL (PF) 1 % IJ SOLN
INTRAMUSCULAR | Status: AC
  Filled 2017-08-07: qty 5

## 2017-08-07 MED ORDER — SODIUM CHLORIDE 0.9 % IR SOLN
Status: DC | PRN
  Administered 2017-08-07: 400 mL

## 2017-08-07 MED ORDER — SUGAMMADEX SODIUM 200 MG/2ML IV SOLN
INTRAVENOUS | Status: DC | PRN
  Administered 2017-08-07: 200 mg via INTRAVENOUS

## 2017-08-07 MED ORDER — OXYCODONE HCL 5 MG PO TABS: 10.0000 mg | ORAL_TABLET | ORAL | Status: DC | PRN

## 2017-08-07 MED ORDER — BISACODYL 10 MG RE SUPP: 10.0000 mg | Freq: Every day | RECTAL | Status: DC | PRN

## 2017-08-07 MED ORDER — OXYCODONE HCL 5 MG PO TABS: 5.0000 mg | ORAL_TABLET | ORAL | Status: DC | PRN

## 2017-08-07 MED ORDER — THROMBIN 5000 UNITS EX SOLR
CUTANEOUS | Status: DC | PRN
  Administered 2017-08-07: 5000 [IU] via TOPICAL

## 2017-08-07 SURGICAL SUPPLY — 91 items
AUGMENT BASEPLATE 25M 35D WEDG (Joint) ×2 IMPLANT
BASEPLATE AUGMNT 25M 35D WEDGE (Joint) ×3 IMPLANT
BIT DRILL 3.2 PERIPHERAL SCREW (BIT) ×3 IMPLANT
BLADE SAGITTAL WIDE XTHICK NO (BLADE) ×3 IMPLANT
BLADE SURG 15 STRL LF DISP TIS (BLADE) ×4 IMPLANT
BLADE SURG 15 STRL SS (BLADE) ×2
BLUEPRINT Perform Gelnoid Guide ×3 IMPLANT
BOWL CEMENT MIX W/ADAPTER (MISCELLANEOUS) ×6 IMPLANT
CANISTER SUCT 1200ML W/VALVE (MISCELLANEOUS) ×3 IMPLANT
CANISTER SUCT 3000ML PPV (MISCELLANEOUS) ×6 IMPLANT
CEMENT BONE 1-PACK (Cement) ×6 IMPLANT
CHLORAPREP W/TINT 26ML (MISCELLANEOUS) ×6 IMPLANT
CNTNR SPEC 2.5X3XGRAD LEK (MISCELLANEOUS) ×4
CONT SPEC 4OZ STER OR WHT (MISCELLANEOUS) ×2
CONTAINER SPEC 2.5X3XGRAD LEK (MISCELLANEOUS) ×4 IMPLANT
COOLER POLAR GLACIER W/PUMP (MISCELLANEOUS) ×3 IMPLANT
CRADLE LAMINECT ARM (MISCELLANEOUS) ×6 IMPLANT
DRAPE IMP U-DRAPE 54X76 (DRAPES) ×6 IMPLANT
DRAPE INCISE IOBAN 66X45 STRL (DRAPES) ×6 IMPLANT
DRAPE SHEET LG 3/4 BI-LAMINATE (DRAPES) ×9 IMPLANT
DRAPE TABLE BACK 80X90 (DRAPES) ×3 IMPLANT
DRAPE U-SHAPE 47X51 STRL (DRAPES) ×6 IMPLANT
DRSG OPSITE POSTOP 4X8 (GAUZE/BANDAGES/DRESSINGS) ×3 IMPLANT
DRSG TEGADERM 2-3/8X2-3/4 SM (GAUZE/BANDAGES/DRESSINGS) ×3 IMPLANT
ELECT CAUTERY BLADE 6.4 (BLADE) ×3 IMPLANT
ELECT REM PT RETURN 9FT ADLT (ELECTROSURGICAL) ×3
ELECTRODE REM PT RTRN 9FT ADLT (ELECTROSURGICAL) ×2 IMPLANT
GAUZE PACK 2X3YD (MISCELLANEOUS) ×3 IMPLANT
GAUZE PETRO XEROFOAM 1X8 (MISCELLANEOUS) ×3 IMPLANT
GLENOID CORTILOC AUG SM RT 25 (Shoulder) ×3 IMPLANT
GLENOSPHERE REV SHOULDER 36 (Joint) ×3 IMPLANT
GLOVE BIO SURGEON STRL SZ7 (GLOVE) ×9 IMPLANT
GLOVE BIOGEL PI IND STRL 7.5 (GLOVE) ×6 IMPLANT
GLOVE BIOGEL PI IND STRL 8 (GLOVE) ×8 IMPLANT
GLOVE BIOGEL PI INDICATOR 7.5 (GLOVE) ×3
GLOVE BIOGEL PI INDICATOR 8 (GLOVE) ×4
GLOVE SURG ORTHO 8.0 STRL STRW (GLOVE) ×12 IMPLANT
GOWN STRL REUS W/ TWL LRG LVL3 (GOWN DISPOSABLE) ×6 IMPLANT
GOWN STRL REUS W/ TWL XL LVL3 (GOWN DISPOSABLE) ×8 IMPLANT
GOWN STRL REUS W/TWL LRG LVL3 (GOWN DISPOSABLE) ×3
GOWN STRL REUS W/TWL XL LVL3 (GOWN DISPOSABLE) ×4
GUIDE GLENOID PATIENT BLUEPRNT (MISCELLANEOUS) ×3 IMPLANT
GUIDEWIRE GLENOID 2.5X220 (WIRE) ×6 IMPLANT
HEMOSTAT SURGICEL 2X3 (HEMOSTASIS) ×6 IMPLANT
HEMOVAC 400CC 10FR (MISCELLANEOUS) ×3 IMPLANT
HOOD PEEL AWAY FLYTE STAYCOOL (MISCELLANEOUS) ×24 IMPLANT
IMPL REVERSE SHOULDER 0X3.5 (Shoulder) ×2 IMPLANT
IMPLANT REVERSE SHOULDER 0X3.5 (Shoulder) ×3 IMPLANT
INSERT HUMERAL 36X6MM 12.5DEG (Insert) ×3 IMPLANT
KIT STABILIZATION SHOULDER (MISCELLANEOUS) ×3 IMPLANT
KIT TURNOVER KIT A (KITS) ×3 IMPLANT
MASK FACE SPIDER DISP (MASK) ×3 IMPLANT
MAT BLUE FLOOR 46X72 FLO (MISCELLANEOUS) ×6 IMPLANT
NDL SAFETY ECLIPSE 18X1.5 (NEEDLE) ×2 IMPLANT
NEEDLE HYPO 18GX1.5 SHARP (NEEDLE) ×1
NEEDLE REVERSE CUT 1/2 CRC (NEEDLE) ×3 IMPLANT
NEEDLE SPNL 20GX3.5 QUINCKE YW (NEEDLE) ×3 IMPLANT
NS IRRIG 1000ML POUR BTL (IV SOLUTION) ×3 IMPLANT
PACK ARTHROSCOPY SHOULDER (MISCELLANEOUS) ×3 IMPLANT
PAD WRAPON POLAR SHDR UNIV (MISCELLANEOUS) ×2 IMPLANT
PULSAVAC PLUS IRRIG FAN TIP (DISPOSABLE) ×3
RESTRICTOR CEMENT (MISCELLANEOUS) ×3 IMPLANT
RETRIEVER SUT HEWSON (MISCELLANEOUS) IMPLANT
SCREW 5.5X14 (Screw) ×3 IMPLANT
SCREW 5.5X22 (Screw) ×6 IMPLANT
SCREW BONE THREAD 6.5X35 (Screw) ×3 IMPLANT
SCREW POST THREADED 9.5X35 (Screw) ×3 IMPLANT
SLING ULTRA II LG (MISCELLANEOUS) IMPLANT
SLING ULTRA II M (MISCELLANEOUS) IMPLANT
SOL .9 NS 3000ML IRR  AL (IV SOLUTION) ×1
SOL .9 NS 3000ML IRR UROMATIC (IV SOLUTION) ×2 IMPLANT
SPONGE GAUZE 2X2 8PLY STRL LF (GAUZE/BANDAGES/DRESSINGS) ×3 IMPLANT
SPONGE LAP 18X18 5 PK (GAUZE/BANDAGES/DRESSINGS) ×12 IMPLANT
STAPLER SKIN PROX 35W (STAPLE) ×3 IMPLANT
STEM HUMERAL SZ4BX104 132.5D (Joint) ×3 IMPLANT
STRAP SAFETY 5IN WIDE (MISCELLANEOUS) ×6 IMPLANT
STRIP CLOSURE SKIN 1/2X4 (GAUZE/BANDAGES/DRESSINGS) ×3 IMPLANT
SUT FIBERWIRE #2 38 BLUE 1/2 (SUTURE) ×18
SUT MNCRL AB 4-0 PS2 18 (SUTURE) IMPLANT
SUT TICRON 2-0 30IN 311381 (SUTURE) ×9 IMPLANT
SUT VIC AB 0 CT2 27 (SUTURE) ×3 IMPLANT
SUT VIC AB 2-0 CT2 27 (SUTURE) ×9 IMPLANT
SUTURE FIBERWR #2 38 BLUE 1/2 (SUTURE) ×12 IMPLANT
SYR 20CC LL (SYRINGE) ×3 IMPLANT
SYR 30ML LL (SYRINGE) ×6 IMPLANT
SYR 5ML LL (SYRINGE) ×3 IMPLANT
SYRINGE IRR TOOMEY STRL 70CC (SYRINGE) ×6 IMPLANT
TAPE TRANSPORE STRL 2 31045 (GAUZE/BANDAGES/DRESSINGS) ×3 IMPLANT
TIP FAN IRRIG PULSAVAC PLUS (DISPOSABLE) ×2 IMPLANT
TRAY FOLEY CATH SILVER 16FR LF (SET/KITS/TRAYS/PACK) ×3 IMPLANT
WRAPON POLAR PAD SHDR UNIV (MISCELLANEOUS) ×3

## 2017-08-07 NOTE — Anesthesia Post-op Follow-up Note (Signed)
Anesthesia QCDR form completed.        

## 2017-08-07 NOTE — Anesthesia Procedure Notes (Signed)
Procedure Name: Intubation Date/Time: 08/07/2017 7:56 AM Performed by: Lowry Bowl, CRNA Pre-anesthesia Checklist: Patient identified, Emergency Drugs available, Suction available and Patient being monitored Patient Re-evaluated:Patient Re-evaluated prior to induction Oxygen Delivery Method: Circle system utilized Preoxygenation: Pre-oxygenation with 100% oxygen Induction Type: IV induction and Cricoid Pressure applied Ventilation: Mask ventilation without difficulty Laryngoscope Size: Mac and 3 Grade View: Grade II Tube type: Oral Tube size: 7.0 mm Number of attempts: 1 Placement Confirmation: ETT inserted through vocal cords under direct vision,  positive ETCO2 and breath sounds checked- equal and bilateral Secured at: 21 cm Tube secured with: Tape Dental Injury: Teeth and Oropharynx as per pre-operative assessment

## 2017-08-07 NOTE — H&P (Signed)
Paper H&P to be scanned into permanent record. H&P reviewed. No significant changes noted.  

## 2017-08-07 NOTE — Anesthesia Preprocedure Evaluation (Signed)
Anesthesia Evaluation  Patient identified by MRN, date of birth, ID band Patient awake    Reviewed: Allergy & Precautions, H&P , NPO status , Patient's Chart, lab work & pertinent test results, reviewed documented beta blocker date and time   History of Anesthesia Complications (+) PONV and history of anesthetic complications  Airway Mallampati: III  TM Distance: >3 FB Neck ROM: full    Dental  (+) Dental Advidsory Given, Caps, Teeth Intact   Pulmonary neg pulmonary ROS,           Cardiovascular Exercise Tolerance: Good negative cardio ROS       Neuro/Psych neg Seizures PSYCHIATRIC DISORDERS Anxiety Depression  Neuromuscular disease (fibromyalgia)    GI/Hepatic Neg liver ROS, GERD  ,  Endo/Other  negative endocrine ROS  Renal/GU negative Renal ROS  negative genitourinary   Musculoskeletal   Abdominal   Peds  Hematology negative hematology ROS (+)   Anesthesia Other Findings Past Medical History: No date: Anxiety No date: Depression No date: Fibromyalgia No date: GERD (gastroesophageal reflux disease) No date: PONV (postoperative nausea and vomiting)     Comment:  once or twice   Reproductive/Obstetrics negative OB ROS                             Anesthesia Physical Anesthesia Plan  ASA: II  Anesthesia Plan: General   Post-op Pain Management:  Regional for Post-op pain   Induction: Intravenous  PONV Risk Score and Plan: 4 or greater and Ondansetron, Dexamethasone, Midazolam, Promethazine and Treatment may vary due to age or medical condition  Airway Management Planned: Oral ETT  Additional Equipment:   Intra-op Plan:   Post-operative Plan: Extubation in OR  Informed Consent: I have reviewed the patients History and Physical, chart, labs and discussed the procedure including the risks, benefits and alternatives for the proposed anesthesia with the patient or authorized  representative who has indicated his/her understanding and acceptance.   Dental Advisory Given  Plan Discussed with: Anesthesiologist, CRNA and Surgeon  Anesthesia Plan Comments:         Anesthesia Quick Evaluation

## 2017-08-07 NOTE — Op Note (Addendum)
Operative Note   SURGERY DATE: 08/07/2017  PRE-OP DIAGNOSIS:  1. Right shoulder glenohumeral osteoarthritis 2. 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 Right biceps tendinopathy  POST-OP DIAGNOSIS: 1. Right shoulder glenohumeral osteoarthritis 2. 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 Right biceps tendinopathy  PROCEDURES:  1. Right total shoulder arthroplasty with conversion to reverse shoulder arthroplasty 2. Right biceps tenodesis 3. Curettage of bony cysts in glenoid and humerus  SURGEON: Cato Mulligan, MD  ASSISTANTS: Reche Dixon, PA  ANESTHESIA: Gen + interscalene block w/Exparil  ESTIMATED BLOOD LOSS: 250cc  TOTAL IV FLUIDS: see anesthesia record  SPECIMEN: Proximal humerus bone cyst sent for pathology  IMPLANTS: Tornier:  - Aequalis PerFORM+ Reversed Half-Wedge Augment Baseplate 39m, 35 degree;  - Aequalis PerFORM Reversed Standard Glenosphere 388m  - 9.61m64m 361m37mntral screw with 3 peripheral screws (superior, anterior, inferior) - Aequalis Ascend Flex, Long PTC Humeral Stem. 4B, 132.5 degree - Flex Shoulder System Reversed Tray, High (3.5) offset - Flex Shoulder System Reversed Insert, 36 + 6mm,31mgle B (12.5) - Cement Restrictor - 24mm 17mmoved implant: Aequalis Perform+ Cortiloc Glenoid Augment, Small 25 degree  INDICATION(S):  Deborah S HaroVAUDA [id number].[id number] with chronic R shoulder pain. There is clinical and radiographic evidence for the diagnosis of shoulder arthritis. Conservative measures including medications and cortisone injections have not provided adequate relief. After discussion of risks, benefits, and alternatives to surgery, the patient elected to proceed. We had discussed pre-operatively that the plan would be for an anatomic shoulder arthroplasty, but if fixation was not adequate due to her deformity and bony cysts in the glenoid, then we may have to perform a reverse shoulder arthroplasty. The patient was in  agreement with this plan.   OPERATIVE FINDINGS: end-stage glenohumeral arthritis, subchondral cysts in glenoid and humeral head, biceps tendinopathy.   OPERATIVE REPORT:   I identified Deborah Walsh in the pre-operative holding area. Informed consent was obtained and the surgical site was marked. I reviewed the risks and benefits of the proposed surgical intervention and the patient (and/or patient's guardian) wished to proceed. An interscalene block with Exparil was administered by the Anesthesia team. The patient was transferred to the operative suite and general anesthesia was administered. The patient was placed in the beach chair position with the head of the bed elevated approximately 45 degrees. All down side pressure points were appropriately padded. Pre-op exam under anesthesia confirmed some stiffness and crepitus. Appropriate IV antibiotics were administered within 30 minutes before incision. Tranexamic acid was also administered after verifying that the patient had no contraindications. The extremity was then prepped and draped in standard fashion. A time out was performed confirming the correct extremity, correct patient, and correct procedure.   We used the standard deltopectoral incision from the coracoid to ~12cm distal. We found the cephalic vein and took it laterally. We opened the deltopectoral interval widely and placed retractors under the CA ligament in the subacromial space and under the deltoid tendon at its insertion. We then abducted and internally rotated the arm and released the underlying bursa between these retractors, taking care not to damage the circumflex branch of the axillary nerve.   Next, we brought the arm back in adduction at slight forward flexion with external rotation. We opened the clavipectoral fascia lateral to the conjoint tendon. We gently palpated the axillary nerve and verified its position and continuity on both sides of the humerus with a Tug test.  We  then cauterized  the anterior humeral circumflex ("Three sisters") vessels. The arm was then internally rotated, we cut the falciform ligament at approximately 1 cm of the upper portion of the pectoralis major insertion. Next we unroofed the bicipital groove. The biceps tendon was tendinopathic with inflammatory synovium surrounding. We proceeded with a soft tissue biceps tenodesis given the pathology of the tendon.  After opening the biceps tendon sheath all the way to the supraglenoid tubercle, we performed a biceps tenodesis with two #2 TiCron sutures to the upper boarder of the pectoralis major. The proximal portion of the tendon was excised.   At this point we visualized the rotator cuff, it was intact.  We performed a lesser tuberosity osteotomy. Next, we dissected the subscapularis off of the underlying capsule and then released the inferior capsule from the humerus all the way to the posterior band of the inferior glenohumeral ligament. The presence of a large subchondral cysts just deep to the lesser tuberosity was noted such that there was a hole ~2x2cm in the cortex from behind where the lesser tuberosity osteotomy was performed. After capsular release was finished, we gently dislocated the shoulder up into the wound. We removed the osteophytes. The cyst walls were debrided with  Curette and rongeur. Care was taken not to take any good cancellous bone. However, the cyst was noted to involve almost the entire metaphysis. We proceeded to cut the proximal humerus with the appropriate inclination in 30 degrees of retroversion with the assistance of a cutting guide.  The humerus was sounded and broached in the standard fashion. However, due to the significant metaphyseal bone loss, the stem did not have good fixation and relied on a diaphyseal fit, which caused the stem to sink a ~27m inferior to the cut at the level of the calcar. Plan at this time was for a cemented stem and lesser tuberosity osteotomy to  heal to proximal coating of stem.   We then turned our attention to the glenoid. A posterior retractor was used to retract the proximal humerus posteriorly. The remnant of the anterior capsule was excised, exposing the anterior glenoid. We then grasped the remaining stump of the biceps and removed the labrum circumferentially. During the glenoid exposure, the axillary nerve was protected the entire time. Capsular release was extended from the glenoid to past the posterior/inferior quadrant.    A patient-specific guide was used to drill the central guidepin. Anterior reaming was then preformed. Posterior reaming was then performed starting with reaming for a 15 degree augment. A trial was placed but there was still a consistent posterior gap. Posterior reaming was then performed with a 25 degree augment. This trial sat flush without any gap or rocking. The peripheral holes were drilled followed by the central hole. The trial sat flush with the reamed glenoid. The inferior posterior hole was in cystic material and there was no firm back wall so we elected not to place cement in this hole. Thrombin and surgicell was used for hemostasis within the holes. The superior and anterior inferior holes were then filled with cement and the glenoid component with 25 degree augment was inserted. This seated well. It was held in place until the cement hardened.   We then turned our attention back to the humerus. A significant greater tuberosity fracture involving the entire tuberosity was noted, likely due to thin cortical window due to cyst. At this point, the decision was made the convert to a reverse shoulder arthroplasty given the more predictable result. Fixation of the  greater tuberosity and continuing with total shoulder arthroplasty would likely have a higher chance of revision surgery if tuberosity did not heal appropriately.   Therefore, we turned our attention back to the glenoid and ensured appropriate  visualization with the retractors as mentioned above. A narrow straight osteotome was used to cut the posterior aspect of the glenoid implant at the level of the superior peg and center hole. A wider curved osteotomy was then used to further remove the inferior pegs, but those came out of the glenoid during implant removal. A new center pin was drilled at the inferior portion of the center hole to avoid the inferior peg holes. It was placed with the appropriate retroversion. Given the posterior glenoid already was reamed for a 25 degree augment, we decided to place a Perform+ augmented baseplate. It was reamed appropriately. However during posterior reaming, the posterior glenoid cyst was encountered. This was debrided gently to not remove any cancellous bone. Afterwards, there was minimal posterior face of the glenoid, but anterior glenoid was intact. Despite this, the trial seated adequately. The central hole was drilled and sized to a 94m screw. It was tapped appropriately. A  6.559mscrew was inserted, but this did not achieve satisfactory fixation. Therefore, a 9.55m66mcrew was placed. This achieved excellent fixation. The peripheral screws were drilled. There was no significant bone posteriorly so this hole was not filled. The other three screws were drilled and appropriately sized screws were placed. A 51m43menosphere was then impacted and secured in place.   We then turned our attention back to the humerus. We placed a trial stem into the canal until it was well seated just inferior to the previously made humeral cut. We placed a high offset baseplate trial and +6 insert. The humerus was trialed and noted to have satisfactory stability, motion, and deltoid tension. We drilled 2 holes just lateral to the bicipital tuberosity and passed 3 #2 Fiberwire sutures through the suprapsinatus tendon and passed one end of each through the bone. Next, two drill holes were placed just medial to the bicipital groove.  Similarly, three #2 Fiberwire sutures were passed through the subscapularis tendon just medial to the lesser tuberosity fragment attached to the subscapularis from the osteotomy. One end of each of these three sutures was passed through the bone. A cement restrictor was placed. The humeral canal was irrigated and dried. Cement was placed into the distal canal and packed with a wet glove. The final humeral component was then inserted at 30 degrees of retroversion and held in place until the cement hardened. Care was taken to ensure that coated surfaces of the implant adjacent to the greater and lesser tuberosities were free of cement. We then rongeured off the excess bone from the calcar to have a smooth surface with the stem. The final tray and insert were impacted into the place. The joint was reduced and we confirmed appropriate shoulder range of motion, stability, and deltoid tension.   The joint was then dislocated again. The greater tuberosity fragment was reduced and cancellous bone graft from the humeral head was packed between the greater tuberosity and the lateral stem.  All three of the supraspinatus sutures were then tied with the greater tuberosity in the appropriate position. The joint was reduced.  The lesser tuberosity sutures were then tied. One previously passed suture through the supraspinatus was passed around through the subscapularis as a cerclage suture. Final confirmation of motion, tension, and stability were satisfactory. A Hemovac drain was  placed.   Vancomycin powder was placed. We closed the deltopectoral interval deep to the cephalic vein with a running, 0-Vicryl suture. The skin was closed with 2-0 Vicryl and staples. Xeroform and Honeycomb dressing was applied. A PolarCare unit and sling were placed. Patient was extubated, transferred to a stretcher bed and to the post antesthesia care unit in stable condition.   Of note, services of a PA were essential to performing the surgery.  PA was able to assist in patient positioning, exposure, retraction, and suturing the wound.   Additionally, there was significantly increased complexity of this case due to the patient's underlying anatomy in regards to the large bony cysts in both the humeral head an glenoid. This caused revision of glenoid component from total shoulder arthroplasty component to reverse shoulder arthroplasty baseplate. Additionally, the glenoid cyst caused difficulty with central screw fixation to the baseplate. All of this contributed to significantly increased surgical time.    POSTOPERATIVE PLAN: The patient will be admitted with plan for discharge home on POD#1. Operative arm to remain in sling at all times except RoM exercises and hygiene. Can perform pendulums, elbow/wrist/hand RoM exercises. Passive RoM allowed to 90 FF and 30 ER. ASA 380m x 6 weeks for DVT ppx. Plan for outpatient PT starting on POD #3-4. Patient to return to clinic in ~2 weeks for post-operative appointment.

## 2017-08-07 NOTE — Transfer of Care (Signed)
Immediate Anesthesia Transfer of Care Note  Patient: Deborah Walsh  Procedure(s) Performed: TOTAL SHOULDER ARTHROPLASTY CONVERTED TO REVERSE TOTAL SHOULDER (Right Shoulder)  Patient Location: PACU  Anesthesia Type:GA combined with regional for post-op pain  Level of Consciousness: awake, alert , oriented and patient cooperative  Airway & Oxygen Therapy: Patient Spontanous Breathing and Patient connected to face mask oxygen  Post-op Assessment: Report given to RN, Post -op Vital signs reviewed and stable and Patient moving all extremities  Post vital signs: Reviewed and stable  Last Vitals:  Vitals Value Taken Time  BP 109/58 08/07/2017  3:02 PM  Temp    Pulse 99 08/07/2017  3:11 PM  Resp 18 08/07/2017  3:11 PM  SpO2 100 % 08/07/2017  3:11 PM  Vitals shown include unvalidated device data.  Last Pain:  Vitals:   08/07/17 0723  TempSrc:   PainSc: 0-No pain         Complications: No apparent anesthesia complications

## 2017-08-07 NOTE — Progress Notes (Signed)
At the time of preparing pt for discharge from PACU, she admits to having 3/10  headache pain.  In addition, she states "I wish my left leg didn't feel so numb and heavy."  Bilateral legs assessed. 1+ pulses, skin w/d.  Turned pt to check for pressure points and none noted.  Dr. Randa Ngo notified.  Lurene Shadow, RN

## 2017-08-07 NOTE — Progress Notes (Signed)
Patient is admitted to room 148 from OR. A&O x4. Shoulder immobilizer in place. Hemovac with no drainage, Foley draining clear yellow urine. Honeycomb dressing in place CD&I. Bed alarm on for safety.

## 2017-08-07 NOTE — Anesthesia Procedure Notes (Signed)
Anesthesia Regional Block: Interscalene brachial plexus block   Pre-Anesthetic Checklist: ,, timeout performed, Correct Patient, Correct Site, Correct Laterality, Correct Procedure, Correct Position, site marked, Risks and benefits discussed,  Surgical consent,  Pre-op evaluation,  At surgeon's request and post-op pain management  Laterality: Right and Upper  Prep: chloraprep       Needles:  Injection technique: Single-shot  Needle Type: Stimiplex     Needle Length: 10cm  Needle Gauge: 22     Additional Needles:   Procedures:,,,, ultrasound used (permanent image in chart),,,,  Narrative:  Start time: 08/07/2017 7:34 AM End time: 08/07/2017 7:37 AM Injection made incrementally with aspirations every 5 mL.  Performed by: Personally  Anesthesiologist: Lenard Simmer, MD  Additional Notes: Functioning IV was confirmed and monitors were applied.  A 50mm 22ga Stimuplex needle was used. Sterile prep and drape,hand hygiene and sterile gloves were used.  Negative aspiration and negative test dose prior to incremental administration of local anesthetic. The patient tolerated the procedure well.

## 2017-08-08 LAB — BASIC METABOLIC PANEL
Anion gap: 3 — ABNORMAL LOW (ref 5–15)
BUN: 12 mg/dL (ref 6–20)
CHLORIDE: 110 mmol/L (ref 101–111)
CO2: 27 mmol/L (ref 22–32)
CREATININE: 0.68 mg/dL (ref 0.44–1.00)
Calcium: 8.2 mg/dL — ABNORMAL LOW (ref 8.9–10.3)
GFR calc Af Amer: >60 mL/min (ref >60)
GFR calc non Af Amer: >60 mL/min (ref >60)
GLUCOSE: 99 mg/dL (ref 65–99)
Potassium: 3.6 mmol/L (ref 3.5–5.1)
SODIUM: 140 mmol/L (ref 135–145)

## 2017-08-08 LAB — CBC
HCT: 26.4 % — ABNORMAL LOW (ref 35.0–47.0)
HEMOGLOBIN: 8.8 g/dL — AB (ref 12.0–16.0)
MCH: 26.2 pg (ref 26.0–34.0)
MCHC: 33.4 g/dL (ref 32.0–36.0)
MCV: 78.5 fL — AB (ref 80.0–100.0)
Platelets: 213 10*3/uL (ref 150–440)
RBC: 3.36 MIL/uL — ABNORMAL LOW (ref 3.80–5.20)
RDW: 15.4 % — ABNORMAL HIGH (ref 11.5–14.5)
WBC: 5 10*3/uL (ref 3.6–11.0)

## 2017-08-08 MED ORDER — OXYCODONE HCL 5 MG PO TABS: 5.0000 mg | ORAL_TABLET | ORAL | 0 refills | Status: AC | PRN

## 2017-08-08 MED ORDER — ONDANSETRON HCL 4 MG PO TABS: 4.0000 mg | ORAL_TABLET | Freq: Four times a day (QID) | ORAL | 1 refills | Status: AC | PRN

## 2017-08-08 MED ORDER — ASPIRIN 325 MG PO TBEC: 325.0000 mg | DELAYED_RELEASE_TABLET | Freq: Every day | ORAL | 0 refills | Status: AC

## 2017-08-08 NOTE — Progress Notes (Addendum)
Subjective: 1 Day Post-Op Procedure(s) (LRB): TOTAL SHOULDER ARTHROPLASTY CONVERTED TO REVERSE TOTAL SHOULDER (Right) Patient reports pain as mild to the right shoulder, block still in effect.   Pt complains of a painful knot on the back of her head and numbness in the left leg.  Pt does report a history of sciatica. Patient is well except for the above complaints. Plan is to go Home after hospital stay. Negative for chest pain and shortness of breath Fever: no Gastrointestinal:Negative for nausea and vomiting  Objective: Vital signs in last 24 hours: Temp:  [98.1 F (36.7 C)-98.8 F (37.1 C)] 98.8 F (37.1 C) (05/11 0932) Pulse Rate:  [89-103] 93 (05/11 0939) Resp:  [13-20] 18 (05/10 1936) BP: (85-150)/(55-80) 111/59 (05/11 0939) SpO2:  [81 %-100 %] 92 % (05/11 0932)  Intake/Output from previous day:  Intake/Output Summary (Last 24 hours) at 08/08/2017 0941 Last data filed at 08/08/2017 0518 Gross per 24 hour  Intake 2130 ml  Output 3235 ml  Net -1105 ml    Intake/Output this shift: No intake/output data recorded.  Labs: Recent Labs    08/08/17 0419  HGB 8.8*   Recent Labs    08/08/17 0419  WBC 5.0  RBC 3.36*  HCT 26.4*  PLT 213   Recent Labs    08/08/17 0419  NA 140  K 3.6  CL 110  CO2 27  BUN 12  CREATININE 0.68  GLUCOSE 99  CALCIUM 8.2*   No results for input(s): LABPT, INR in the last 72 hours.   EXAM General - Patient is Alert, Appropriate and Oriented Extremity - ABD soft Incision: dressing C/D/I No cellulitis present  Pt is intact to light touch to the right upper extremity today. Able to flex and extend fingers and wrist. Pulses intact. Dressing/Incision - clean, dry, no drainage, hemovac drain removed. Motor Function - intact, moving foot and toes well on exam.   Pt has a elevated area that is tender along the posterior aspect of her head, no signs of bleeding or drainage.  Pt reports numbness and tingling in the left leg however  she is intact to light touch to the left peroneal nerve distribution and sural nerve distribution.  Full strength with ankle dorsiflexion and plantar flexion. Able to flex and extend knee without discomfort.  Past Medical History:  Diagnosis Date  . Anxiety   . Depression   . Fibromyalgia   . GERD (gastroesophageal reflux disease)   . PONV (postoperative nausea and vomiting)    once or twice    Assessment/Plan: 1 Day Post-Op Procedure(s) (LRB): TOTAL SHOULDER ARTHROPLASTY CONVERTED TO REVERSE TOTAL SHOULDER (Right) Active Problems:   Status post shoulder replacement  Estimated body mass index is 28.98 kg/m as calculated from the following:   Height as of this encounter:  (1.702 m).   Weight as of this encounter: 83.9 kg (185 lb). Advance diet Up with therapy D/C IV fluids when tolerating po intake.  Labs reviewed this AM. Hemovac removed this AM. Pt reports N/T in left leg, pt does have a history of sciatica, likely flair from being on operating table or irritation to the peroneal nerve.  No loss of strength. Up with therapy today. Plan will be for discharge home this afternoon.  DVT Prophylaxis - Lovenox, Foot Pumps and TED hose Non-weightbearing to the right upper extremity.  Valeria Batman, PA-C [facility] 08/08/2017, 9:41 AM    ADDENDUM: Reports numbness in LLE and knot on back of head  are both improving. Minimal to no pain in R shoulder.   LLE: 5/5 DF/PF/EHL, KF/KE, HF SILT s/s/t/dp distr. Mild numbness in sp distr. No numbness about proximal leg or thigh Foot wwp  RUE: +ain/pin/u/delt motor Sensation returning distally in hand +rad pulse Sling in place  PT/OT recommendation was for SNF, but patient and husband feel like they will be safe at home. ASA /day for DVT ppx. Maintain sling -- can come out for elbow/wrist/hand RoM exercises. PT on POD#4. We discussed that knot on head was likely from head holder during surgery and  there may have been some movement and/or pressure with her surgical time. Additionally, her LLE symptoms were also likely due to surgical time and underlying sciatica. Both should resolve over time. Plan for DC home today. Patient to call our offices with any questions.

## 2017-08-08 NOTE — Discharge Summary (Signed)
Physician Discharge Summary  Patient ID: Deborah Walsh MRN: [id number] DOB/AGE: 1955-07-07 62 y.o.  Admit date: 08/07/2017 Discharge date: 08/08/2017  Admission Diagnoses:  RIGHT GLENOHUMERAL ARTHRITIS  Discharge Diagnoses: Patient Active Problem List   Diagnosis Date Noted  . 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 Anxiety 12/27/2013  . Depression 12/27/2013   Past Medical History:  Diagnosis Date  . Anxiety   . Depression   . Fibromyalgia   . GERD (gastroesophageal reflux disease)   . PONV (postoperative nausea and vomiting)    once or twice     Transfusion: None.   Consultants (if any):   Discharged Condition: Improved  Hospital Course: Deborah Walsh is an 62 y.o. female who was admitted 08/07/2017 with a diagnosis of right glenohumeral osteoarthritis  and went to the operating room on 08/07/2017 and underwent the above named procedures.    Surgeries: Procedure(s): TOTAL SHOULDER ARTHROPLASTY CONVERTED TO REVERSE TOTAL SHOULDER on 08/07/2017 Patient tolerated the surgery well. Taken to PACU where she was stabilized and then transferred to the orthopedic floor.  Started on aspirin 358m daily. Foot pumps applied bilaterally at 80 mm. Heels elevated on bed with rolled towels. No evidence of DVT. Negative Homan. Physical therapy started on day #1 for gait training and transfer. OT started day #1 for ADL and assisted devices.  Patient's IV, foley and hemovac drain were all removed on POD1.  Implants:  - Aequalis PerFORM+ Reversed Half-Wedge Augment Baseplate 25m 35 degree;  - Aequalis PerFORM Reversed Standard Glenosphere 3619m - 9.5mm107m35mm78mtral screw with 3 peripheral screws (superior, anterior, inferior) - Aequalis Ascend Flex, Long PTC Humeral Stem. 4B, 132.5 degree - Flex Shoulder System Reversed Tray, High (3.5) offset - Flex Shoulder System Reversed Insert, 36 + 6mm, 55mle B (12.5) - Cement Restrictor - 24mm -33moved implant: Aequalis  Perform+ Cortiloc Glenoid Augment, Small 25 degree  She was given perioperative antibiotics:  Anti-infectives (From admission, onward)   Start     Dose/Rate Route Frequency Ordered Stop   08/07/17 1800  ceFAZolin (ANCEF) IVPB 2g/100 mL premix     2 g 200 mL/hr over 30 Minutes Intravenous Every 6 hours 08/07/17 1726 08/08/17 0550   08/07/17 0844  vancomycin (VANCOCIN) powder  Status:  Discontinued       As needed 08/07/17 0847 08/07/17 1456   08/07/17 0601  ceFAZolin (ANCEF) 2-4 GM/100ML-% IVPB    Note to Pharmacy:  Kinney,Ronnell [place] override      08/07/17 0601 08/07/17 0813   08/06/17 2330  ceFAZolin (ANCEF) IVPB 2g/100 mL premix     2 g 200 mL/hr over 30 Minutes Intravenous  Once 08/06/17 2316 08/07/17 1223    .  She was given sequential compression devices, early ambulation, and aspirin for DVT prophylaxis.  She benefited maximally from the hospital stay and there were no complications.    Recent vital signs:  Vitals:   08/08/17 0935 08/08/17 0939  BP: (!) 85/59 (!) 111/59  Pulse: (!) 103 93  Resp:    Temp:    SpO2:     Recent laboratory studies:  Lab Results  Component Value Date   HGB 8.8 (L) 08/08/2017   HGB 10.5 (L) 08/04/2017   HGB 11.9 (A) 09/14/2011   Lab Results  Component Value Date   WBC 5.0 08/08/2017   PLT 213 08/08/2017   Lab Results  Component Value Date   INR 0.92 08/04/2017   Lab Results  Component Value  Date   NA 140 08/08/2017   K 3.6 08/08/2017   CL 110 08/08/2017   CO2 27 08/08/2017   BUN 12 08/08/2017   CREATININE 0.68 08/08/2017   GLUCOSE 99 08/08/2017   Discharge Medications:   Allergies as of 08/08/2017      Reactions   Sulfa Antibiotics Nausea And Vomiting      Medication List    TAKE these medications   aspirin 325 MG EC tablet Take 1 tablet (325 mg total) by mouth daily. Start taking on:  08/09/2017   GLUCOSAMINE PO Take 2 tablets by mouth daily.   naproxen sodium 220 MG tablet Commonly known as:   ALEVE Take 440 mg by mouth 2 (two) times daily as needed (pain).   nortriptyline 50 MG capsule Commonly known as:  PAMELOR Take 50 mg by mouth at bedtime.   ondansetron 4 MG tablet Commonly known as:  ZOFRAN Take 1 tablet (4 mg total) by mouth every 6 (six) hours as needed for nausea.   oxyCODONE 5 MG immediate release tablet Commonly known as:  Oxy IR/ROXICODONE Take 1-2 tablets (5-10 mg total) by mouth every 4 (four) hours as needed for moderate pain (pain score 4-6).   sertraline 100 MG tablet Commonly known as:  ZOLOFT Take 150 mg by mouth daily.      Diagnostic Studies: Ct Shoulder Right Wo Contrast  Result Date: 07/15/2017 CLINICAL DATA:  Osteoarthritis of the glenohumeral joint. Preoperative planning. Limited range of motion. Pain. EXAM: CT OF THE UPPER RIGHT EXTREMITY WITHOUT CONTRAST TECHNIQUE: Multidetector CT imaging of the upper right extremity was performed according to the [employer] protocol. COMPARISON:  MRI dated 07/14/2017 FINDINGS: Bones/Joint/Cartilage There is severe arthritis of the glenohumeral joint with diffuse full-thickness cartilage loss, subcortical cyst formation in the glenoid and humeral head, with a 2 cm area of erosion of the articular cortex of humeral head. There are marginal osteophytes on the inferior aspect of the humeral head. Minimal degenerative changes of the [facility] joint.  Type 1 acromion. Muscles and Tendons Slight diffuse atrophy of the muscles of the rotator cuff. IMPRESSION: Severe arthritis of the glenohumeral joint as described. Electronically Signed   By: Lorriane Shire M.D.   On: 07/15/2017 10:17   Mr Shoulder Right Wo Contrast  Result Date: 07/15/2017 CLINICAL DATA:  Chronic progressive right shoulder pain with limited range of motion. Severe arthritis of the glenohumeral joint. EXAM: MRI OF THE RIGHT SHOULDER WITHOUT CONTRAST TECHNIQUE: Multiplanar, multisequence MR imaging of the shoulder was performed. No intravenous contrast was  administered. COMPARISON:  CT scan dated 07/14/2017 FINDINGS: Rotator cuff:  Intact. Muscles:  Slight atrophy of the muscles of the rotator cuff. Biceps long head:  Properly located and intact. Acromioclavicular Joint: Minimal degenerative changes. Type 1 acromion. No bursitis Glenohumeral Joint: Severe arthritic changes of the glenohumeral joint with diffuse denuding of the articular cartilage with subcortical edema in the glenoid and humeral head. There is flattening of the contour of the articular surface of the humeral head marginal osteophyte formation on the inferior medial aspect of the humeral head. Small glenohumeral joint effusion. Labrum:  Diminutive but without a discrete tear. Bones: Severe arthritic changes of the glenohumeral joint with flattening of the articular surface of the humeral head with subcortical edema and subcortical cyst formation in the humeral head and glenoid. Other: None. IMPRESSION: 1. Severe arthritis of the glenohumeral joint as described. 2. Intact rotator cuff. Electronically Signed   By: Lorriane Shire M.D.   On: 07/15/2017  08:27   Dg Shoulder Right Port  Result Date: 08/07/2017 CLINICAL DATA:  Right shoulder replacement EXAM: PORTABLE RIGHT SHOULDER COMPARISON:  Rotator cuff MRI 07/14/2017 FINDINGS: New glenohumeral arthroplasty. Prosthesis is normally aligned in the AP projection. No fracture deformity is seen. Expected soft tissue gas. Surgical drain. IMPRESSION: New glenohumeral arthroplasty.  No acute finding. Electronically Signed   By: Monte Fantasia M.D.   On: 08/07/2017 16:54   Disposition: Plan is for discharge home today, 08/08/17  Follow-up Information    Leim Fabry, MD Follow up in 14 day(s).   Specialty:  Orthopedic Surgery Why:  Staple [employer]oval Contact information: [place] [address] [phone]          Signed: Judson Roch  PA-C 08/08/2017, 9:55 AM

## 2017-08-08 NOTE — Evaluation (Signed)
Physical Therapy Evaluation Patient Details Name: Deborah Walsh MRN: [id number] DOB: [birth year] Today's Date: 08/08/2017   History of Present Illness  62 yo female with new R reverse total shoulder converted from previous total shoulder was referred to PT for mobility.  PLOF was gait with no AD, home with husband who is employed.  Pt is having return of L leg numbness after being on L side for surgery 6 hours.  Has chronic orthostasis as well.  PMHx:  R shoulder OA, R humeral bone cysts, R biceps tenodesis, anxiety, depression, fibromyalgia, back pain  Clinical Impression  Pt was unsafe with gait on L hemiwalker and has close guard of PT for taking only steps along side of bed.  When PT attempted to assist to chair she was stumbling and unsafe, but also had O2 sats down to 90% with the effort and standing BP was 85/59.  Her request was to go to SNF but family may want to go directly home.  If so will need a supportive gait device like a hemiwalker, and will need 24/7 assist for all mobility given her limits of safe movement.  Has been hypotensive at home and per nsg has not taken meds post op that might be cause for lower BP's.  Follow acutely for LLE strengthening and control of standing balance and monitoring of BP.    Follow Up Recommendations SNF    Equipment Recommendations  Other (comment)(hemiwalker vs cane, can be determined at SNF)    Recommendations for Other Services       Precautions / Restrictions Precautions Precautions: Fall Precaution Comments: needs ck of O2 sats and BP on standing Restrictions Weight Bearing Restrictions: Yes RUE Weight Bearing: Non weight bearing Other Position/Activity Restrictions: L leg is numb and weak      Mobility  Bed Mobility Overal bed mobility: Needs Assistance Bed Mobility: Supine to Sit     Supine to sit: Min guard     General bed mobility comments: safety concerns as pt is numb over LLE and cannot control leg as well as  RLE  Transfers Overall transfer level: Needs assistance Equipment used: 1 person hand held assist;Hemi-walker Transfers: Sit to/from Stand Sit to Stand: Mod assist;From elevated surface         General transfer comment: cued to reach back with L hand to chair to sit  Ambulation/Gait Ambulation/Gait assistance: Min assist Ambulation Distance (Feet): 10 Feet(5 x 2 sidestepping on bed) Assistive device: Hemi-walker;1 person hand held assist Gait Pattern/deviations: Step-to pattern;Decreased weight shift to left;Decreased stride length;Decreased stance time - left;Wide base of support;Trunk flexed Gait velocity: reduced Gait velocity interpretation: <1.8 ft/sec, indicate of risk for recurrent falls General Gait Details: pt is using hemiwalker to reduce wb on LLE as she is unsteady and concerned the knee is going to give out  Stairs            Wheelchair Mobility    Modified Rankin (Stroke Patients Only)       Balance Overall balance assessment: Needs assistance Sitting-balance support: Feet supported;Single extremity supported Sitting balance-Leahy Scale: Fair     Standing balance support: Single extremity supported;During functional activity Standing balance-Leahy Scale: Poor                               Pertinent Vitals/Pain Pain Assessment: Faces Faces Pain Scale: Hurts little more Pain Location: R arm with mobility Pain Descriptors / Indicators: Operative site guarding Pain  Intervention(s): Limited activity within patient's tolerance;Monitored during session;Repositioned    Home Living Family/patient expects to be discharged to:: Private residence Living Arrangements: Spouse/significant other Available Help at Discharge: Family;Available PRN/intermittently Type of Home: House Home Access: Stairs to enter   Entrance Stairs-Number of Steps: 1 Home Layout: One level Home Equipment: None      Prior Function Level of Independence: Independent                Hand Dominance   Dominant Hand: Left    Extremity/Trunk Assessment   Upper Extremity Assessment Upper Extremity Assessment: RUE deficits/detail RUE Deficits / Details: sling after reverse total shoulder RUE: Unable to fully assess due to immobilization    Lower Extremity Assessment Lower Extremity Assessment: LLE deficits/detail LLE Deficits / Details: weak and numb partially on foot and leg LLE Coordination: decreased fine motor;decreased gross motor    Cervical / Trunk Assessment Cervical / Trunk Assessment: Normal  Communication   Communication: No difficulties  Cognition Arousal/Alertness: Lethargic;Awake/alert Behavior During Therapy: Flat affect;Impulsive Overall Cognitive Status: Impaired/Different from baseline Area of Impairment: Safety/judgement;Awareness                         Safety/Judgement: Decreased awareness of deficits;Decreased awareness of safety Awareness: Intellectual   General Comments: pt attempted to transfer and move with fast transition despite PT asking her to wait, was not feeling well and maybe distracted      General Comments General comments (skin integrity, edema, etc.): pt was able to transition to sitting but then in standing was light headed and asked to sit quickly.  O2 sats dropped from 95% at rest to 90% in standing with light headed feeling.  BP in sitting was 107/55 and standing 85/59.  Recovered to 111/47 sitting.      Exercises     Assessment/Plan    PT Assessment Patient needs continued PT services  PT Problem List Decreased strength;Decreased range of motion;Decreased activity tolerance;Decreased balance;Decreased mobility;Decreased coordination;Decreased knowledge of use of DME;Decreased safety awareness;Cardiopulmonary status limiting activity;Decreased skin integrity;Pain       PT Treatment Interventions DME instruction;Gait training;Stair training;Functional mobility training;Therapeutic  activities;Therapeutic exercise;Balance training;Neuromuscular re-education;Patient/family education    PT Goals (Current goals can be found in the Care Plan section)  Acute Rehab PT Goals Patient Stated Goal: to get her feeling back on LLE and feel safer standing PT Goal Formulation: With patient/family Time For Goal Achievement: 08/22/17 Potential to Achieve Goals: Good    Frequency Min 2X/week   Barriers to discharge Inaccessible home environment one stair to enter and pt cannot reliably walk    Co-evaluation               AM-PAC PT "6 Clicks" Daily Activity  Outcome Measure Difficulty turning over in bed (including adjusting bedclothes, sheets and blankets)?: A Little Difficulty moving from lying on back to sitting on the side of the bed? : A Lot Difficulty sitting down on and standing up from a chair with arms (e.g., wheelchair, bedside commode, etc,.)?: Unable Help needed moving to and from a bed to chair (including a wheelchair)?: A Lot Help needed walking in hospital room?: A Lot Help needed climbing 3-5 steps with a railing? : Total 6 Click Score: 11    End of Session Equipment Utilized During Treatment: Gait belt Activity Tolerance: Patient limited by fatigue;Treatment limited secondary to medical complications (Comment) Patient left: in chair;with call bell/phone within reach;with nursing/sitter in room;with family/visitor present Nurse Communication:  Mobility status;Precautions;Other (comment)(nursing in to ck orthstatics) PT Visit Diagnosis: Unsteadiness on feet (R26.81);Dizziness and giddiness (R42);Muscle weakness (generalized) (M62.81);Pain Pain - Right/Left: Right Pain - part of body: Shoulder    Time: 9604-5409 PT Time Calculation (min) (ACUTE ONLY): 38 min   Charges:   PT Evaluation $PT Eval Moderate Complexity: 1 Mod PT Treatments $Gait Training: 8-22 mins $Therapeutic Activity: 8-22 mins   PT G Codes:   PT G-Codes **NOT FOR INPATIENT  CLASS** Functional Assessment Tool Used: AM-PAC 6 Clicks Basic Mobility    Ivar Drape 08/08/2017, 11:59 AM   Samul Dada, PT MS Acute Rehab Dept. Number: [facility] [id number] and [facility] [phone]

## 2017-08-08 NOTE — Discharge Instructions (Signed)
Deborah H. Patel, MD  [facility]  Phone: [phone]  Fax: [phone]   Discharge Instructions after Reverse Shoulder Replacement    1. Activity/Sling: You are to be non-weight bearing on operative extremity. A sling/shoulder immobilizer has been provided for you. Only remove the sling to perform elbow, wrist, and hand RoM exercises and hygiene/dressing. Active reaching and lifting are not permitted. You will be given further instructions on sling use at your first physical therapy visit and postoperative visit with Dr. Patel.   2. Dressings: Dressing may be removed at 1st physical therapy visit (~3-4 days after surgery). Afterwards, you may either leave open to air (if no drainage) or cover with dry, sterile dressing. If you have steri-strips on your wound, please do not remove them. They will fall off on their own. You may shower 5 days after surgery. Please pat incision dry. Do not rub or place any shear forces across incision. If there is drainage or any opening of incision after 5 days, please notify our offices immediately.    3. Driving:  Plan on not driving for six weeks. Please note that you are advised NOT to drive while taking narcotic pain medications as you may be impaired and unsafe to drive.   4. Medications:  - You have been provided a prescription for narcotic pain medicine (usually oxycodone). After surgery, take 1-2 narcotic tablets every 4 hours if needed for severe pain. Please start this as soon as you begin to start having pain (if you received a nerve block, start taking as soon as this wears off).  - A prescription for anti-nausea medication will be provided in case the narcotic medicine causes nausea - take 1 tablet every 6 hours only if nauseated.  - Take enteric coated aspirin 325 mg once daily for 6 weeks to prevent blood clots. Do not take aspirin if you have an aspirin sensitivity/allergy or asthma or are on an anticoagulant (blood thinner) already. If so, then  your home anticoagulant will be resume and managed - do not take aspirin. -Take tylenol 1000mg (2 Extra strength or 3 regular strength tablets) every 8 hours for pain. This will reduce the amount of narcotic medication needed. May stop tylenol when you are having minimal pain. - Take a stool softener (Colace, Dulcolax or Senakot) if you are using narcotic pain medications to help with constipation that is associated with narcotic use. - DO NOT take ANY nonsteroidal anti-inflammatory pain medications: Advil, Motrin, Ibuprofen, Aleve, Naproxen, or Naprosyn.   If you are taking prescription medication for anxiety, depression, insomnia, muscle spasm, chronic pain, or for attention deficit disorder you are advised that you are at a higher risk of adverse effects with use of narcotics post-op, including narcotic addiction/dependence, depressed breathing, death. If you use non-prescribed substances: alcohol, marijuana, cocaine, heroin, methamphetamines, etc., you are at a higher risk of adverse effects with use of narcotics post-op, including narcotic addiction/dependence, depressed breathing, death. You are advised that taking > 50 morphine milligram equivalents (MME) of narcotic pain medication per day results in twice the risk of overdose or death. For your prescription provided: oxycodone 5 mg - taking more than 6 tablets per day after the first few days of surgery.   5. Physical Therapy: 1-2 times per week for ~12 weeks. Therapy typically starts on post operative Day 3 or 4. You have been provided an order for physical therapy. The therapist will provide home exercises. Please contact our offices if this appointment has not been scheduled.      6. Work: May do light duty/desk job in approximately 2 weeks when off of narcotics, pain is well-controlled, and swelling has decreased if able to function with one arm in sling. Full work may take 6 weeks if light motions and function of both arms is required.  Lifting jobs may require 12 weeks.   7. Post-Op Appointments: Your first post-op appointment will be with Dr. Patel in approximately 2 weeks time.    If you find that they have not been scheduled please call the Orthopaedic Appointment front desk at [phone].                               Deborah H. Patel, MD [facility] Phone: [phone] Fax: [phone]   REVERSE SHOULDER ARTHROPLASTY REHAB GUIDELINES   These guidelines should be tailored to individual patients based on their rehab goals, age, precautions, quality of repair, etc.  Progression should be based on patient progress and approval by the referring physician.  PHASE 1 - Day 1 through Week 2  GENERAL GUIDELINES AND PRECAUTIONS Sling wear 24/7 except during grooming and home exercises (3 to 5 times daily) Avoid shoulder extension such that the arm is posterior the frontal plane.  When patients recline, a pillow should be placed behind the upper arm and sling should be on.  They should be advised to always be able to see the elbow Avoid combined IR/ADD/EXT, such as hand behind back to prevent dislocation Avoid combined IR and ADD such as reaching across the chest to prevent dislocation No AROM No submersion in pool/water for 4 weeks No weight bearing through operative arm (as in transfers, walker use, etc.)  GOALS Maintain integrity of joint replacement; protect soft tissue healing Increase PROM for elevation to 120 and ER to 30 (will remain the goal for first 6 weeks) Optimize distal UE circulation and muscle activity (elbow, wrist and hand) Instruct in use of sling for proper fit, polar care device for ice application after HEP, signs/symptoms of infection  EXERCISES Active elbow, wrist and hand Passive forward elevation in scapular plane to 90-120 max motion; ER in scapular plane to 30 Active scapular retraction with arms resting in neutral position  CRITERIA TO PROGRESS TO  PHASE 2 Low pain (less than 3/10) with shoulder PROM Healing of incision without signs of infection Clearance by MD to advance after 2 week MD check up  PHASE 2 - 2 weeks - 6 weeks  GENERAL GUIDELINES AND PRECAUTIONS Sling may be removed while at home; worn in community without abduction pillow May use arm for light activities of daily living (such as feeding, brushing teeth, dressing.) with elbow near  the side of the body  and arm in front of the body- no active lifting of the arm May submerge in water (tub, pool, Jacuzzi, etc.) after 4 weeks Continue to avoid WBing through the operative arm Continue to avoid combined IR/EXT/ADD (hand behind the back) and IR/ADD  (reaching across chest) for dislocation precautions  GOALS  Achieve passive elevation to 120 and ER to 30  Low (less than 3/10) to no pain  Ability to fire all heads of the deltoid  EXERCISES May discontinue grip, and active elbow and wrist exercises since using the arm in ADL's  with sling removed around the home Continue passive elevation to 120 and ER to 30, both in scapular plane with arm supported on table top Add submaximal isometrics, pain free effort, for all   functional heads of deltoid (anterior, posterior, middle)  Ensure that with posterior deltoid isometric the shoulder does not move into extension and the arm remains anterior the frontal plane At 4 weeks:  begin to place arm in balanced position of 90 deg elevation in supine; when patient able to hold this position with ease, may begin reverse pendulums clockwise and counterclockwise  CRITERIA TO PROGRESS TO PHASE 3 Passive forward elevation in scapular plane to 120; passive ER in scapular plane to 30 Ability to fire isometrically all heads of the deltoid muscle without pain Ability to place and hold the arm in balanced position (90 deg elevation in supine)  PHASE 3 - 6 weeks to 3 months  GENERAL GUIDELINES AND PRECAUTIONS Discontinue use of sling Avoid  forcing end range motion in any direction to prevent dislocation  May advance use of the arm actively in ADL's without being restricted to arm by the side of the body, however, avoid heavy lifting and sports (forever!) May initiate functional IR behind the back gently NO UPPER BODY ERGOMETER   GOALS Optimize PROM for elevation and ER in scapular plane with realistic expectation that max  mobility for elevation is usually around 145-160 passively; ER 40 to 50 passively; functional IR to L1 Recover AROM to approach as close to PROM available as possible; may expect 135-150 deg active elevation; 30 deg active ER; active functional IR to L1 Establish dynamic stability of the shoulder with deltoid and periscapular muscle gradual strengthening  EXERCISES Forward elevation in scapular plane active progression: supine to incline, to vertical; short to long lever arm Balanced position long lever arm AROM Active ER/IR with arm at side Scapular retraction with light band resistance Functional IR with hand slide up back - very gentle and gradual NO UPPER BODY ERGOMETER     CRITERIA TO PROGRESS TO PHASE 4  AROM equals/approaches PROM with good mechanics for elevation   No pain  Higher level demand on shoulder than ADL functions   PHASE 4 12 months and beyond  GENERAL GUIDELINES AND PRECAUTIONS No heavy lifting and no overhead sports No heavy pushing activity Gradually increase strength of deltoid and scapular stabilizers; also the rotator cuff if present with weights not to exceed 5 lbs NO UPPER BODY ERGOMETER   GOALS  Optimize functional use of the operative UE to meet the desired demands  Gradual increase in deltoid, scapular muscle, and rotator cuff strength  Pain free functional activities   EXERCISES Add light hand weights for deltoid up to and not to exceed 3 lbs for anterior and posterior with long arm lift against gravity; elbow bent to 90 deg for abduction in scapular  plane Theraband progression for extension to hip with scapular depression/retraction Theraband progression for serratus anterior punches in supine; avoid wall, incline or prone pressups for serratus anterior End range stretching gently without forceful overpressure in all planes (elevation in scapular plane, ER in scapular plane, functional IR) with stretching done for life as part of a daily routine NO UPPER BODY ERGOMETER     CRITERIA FOR DISCHARGE FROM SKILLED PHYSICAL THERAPY  Pain free AROM for shoulder elevation (expect around 135-150)  Functional strength for all ADL's, work tasks, and hobbies approved by surgeon  Independence with home maintenance program   NOTES: 1. With proper exercise, motion, strength, and function continue to improve even after one year. 2. The complication rate after surgery is 5 - 8%. Complications include infection, fracture, heterotopic bone formation, nerve injury, instability, rotator cuff   tear, and tuberosity nonunion. Please look for clinical signs, unusual symptoms, or lack of progress with therapy and report those to Dr. Patel. Prefer more communication than less.  3. The therapy plan above only serves as a guide. Please be aware of specific individualized patient instructions as written on the prescription or through discussions with the surgeon. 4. Please call Dr. Patel if you have any specific questions or concerns [phone]    

## 2017-08-08 NOTE — Evaluation (Signed)
Occupational Therapy Evaluation Patient Details Name: Deborah Walsh MRN: [id number] DOB: Mar 07, 1956 Today's Date: 08/08/2017    History of Present Illness Pt. 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  PMHx:  R shoulder OA, R humeral bone cysts, R biceps tenodesis, anxiety, depression, fibromyalgia, back pain   Clinical Impression   Pt. presents with weakness, limited activity tolerance, dominant RUE immobilization, and limited functional mobility which limits her ability to complete basic ADL and IADL functioning. Pt. Resides at home with her husband. Pt. was independent with ADLs, and IADL functioning: including meal preparation, and medication management. 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 Pt. education was provided about brace care, polar care, positioning, one armed UE dressing techniques, A/E use for LE ADLs. Pt. Could benefit from OT services for ADL training, A/E training, and pt. Education about home modification, and DME. Pt. would benefitt from SNF level of care upon discharge. Pt. Could benefit from follow-up OT services at discharge to improve independence with ADLS, IADLs, and return to her PLOF.    Follow Up Recommendations  SNF    Equipment Recommendations       Recommendations for Other Services       Precautions / Restrictions Precautions Precautions: Fall Precaution Comments: needs ck of O2 sats and BP on standing Restrictions Weight Bearing Restrictions: Yes RUE Weight Bearing: Non weight bearing Other Position/Activity Restrictions: L leg is numb and weak         Balance Overall balance assessment: Needs assistance Sitting-balance support: Feet supported;Single extremity supported Sitting balance-Leahy Scale: Fair     Standing balance support: Single extremity supported;During functional activity Standing balance-Leahy Scale: Poor                             ADL  either performed or assessed with clinical judgement   ADL Overall ADL's : Needs assistance/impaired Eating/Feeding: Minimal assistance;Set up   Grooming: Moderate assistance   Upper Body Bathing: Maximal assistance   Lower Body Bathing: Moderate assistance   Upper Body Dressing : Moderate assistance   Lower Body Dressing: Moderate assistance               Functional mobility during ADLs: Moderate assistance General ADL Comments: Pt. education was provided about one armed self-dressing techniques.      Vision Baseline Vision/History: No visual deficits Patient Visual Report: No change from baseline       Perception     Praxis      Pertinent Vitals/Pain Pain Assessment: 0-10 Pain Score: 0-No pain Faces Pain Scale: Hurts little more Pain Location: R arm with mobility Pain Descriptors / Indicators: Operative site guarding Pain Intervention(s): Limited activity within patient's tolerance;Monitored during session;Repositioned     Hand Dominance Right   Extremity/Trunk Assessment Upper Extremity Assessment Upper Extremity Assessment: RUE deficits/detail RUE Deficits / Details: Sling in place following surgery RUE: Unable to fully assess due to immobilization   Cervical / Trunk Assessment Cervical / Trunk Assessment: Normal   Communication Communication Communication: No difficulties   Cognition Arousal/Alertness: Awake/alert Behavior During Therapy: Flat affect Overall Cognitive Status: Within Functional Limits for tasks assessed Area of Impairment: Safety/judgement;Awareness                         Safety/Judgement: Decreased awareness of deficits;Decreased awareness of safety Awareness: Intellectual  General Comments     Exercises     Shoulder Instructions      Home Living Family/patient expects to be discharged to:: Private residence Living Arrangements: Spouse/significant other Available Help at Discharge: Family;Available 24  hours/day Type of Home: House Home Access: Stairs to enter [employer] of Steps: 1   Home Layout: One level     Bathroom Shower/Tub: Producer, television/film/video: Standard     Home Equipment: None;Shower seat - built in;Hand held shower head          Prior Functioning/Environment Level of Independence: Independent        Comments: Pt. was indepedent with ADLs, IADLs, driving, working full-time in a lab.        OT Problem List: Decreased strength;Decreased activity tolerance;Decreased range of motion;Decreased safety awareness;Impaired UE functional use;Pain;Decreased knowledge of use of DME or AE      OT Treatment/Interventions: Self-care/ADL training;Therapeutic exercise;DME and/or AE instruction;Therapeutic activities;Patient/family education    OT Goals(Current goals can be found in the care plan section) Acute Rehab OT Goals Patient Stated Goal: To return home OT Goal Formulation: With patient Potential to Achieve Goals: Good  OT Frequency: Min 2X/week   Barriers to D/C:            Co-evaluation              AM-PAC PT "6 Clicks" Daily Activity     Outcome Measure Help from another person eating meals?: A Little Help from another person taking care of personal grooming?: A Little Help from another person toileting, which includes using toliet, bedpan, or urinal?: A Lot Help from another person bathing (including washing, rinsing, drying)?: A Lot Help from another person to put on and taking off regular upper body clothing?: A Lot Help from another person to put on and taking off regular lower body clothing?: A Lot 6 Click Score: 14   End of Session Equipment Utilized During Treatment: Gait belt  Activity Tolerance: Patient tolerated treatment well Patient left: in chair;with call bell/phone within reach;with chair alarm set  OT Visit Diagnosis: Unsteadiness on feet (R26.81)                Time: 1610-9604 OT Time Calculation (min):  20 min Charges:  OT General Charges $OT Visit: 1 Visit OT Evaluation $OT Eval Moderate Complexity: 1 Mod G-Codes:     Olegario Messier, MS, OTR/L   Olegario Messier, MS, OTR/L 08/08/2017, 12:49 PM

## 2017-08-09 NOTE — Anesthesia Postprocedure Evaluation (Signed)
Anesthesia Post Note  Patient: Deborah Walsh  Procedure(s) Performed: TOTAL SHOULDER ARTHROPLASTY CONVERTED TO REVERSE TOTAL SHOULDER (Right Shoulder)  Patient location during evaluation: PACU Anesthesia Type: General Level of consciousness: awake and alert Pain management: pain level controlled Vital Signs Assessment: post-procedure vital signs reviewed and stable Respiratory status: spontaneous breathing, nonlabored ventilation, respiratory function stable and patient connected to nasal cannula oxygen Cardiovascular status: blood pressure returned to baseline and stable Postop Assessment: no apparent nausea or vomiting Anesthetic complications: no Comments: Patient reported pain and numbness in her leg in the PACU.  Please see note from intraop record for in depth explanation.     Last Vitals:  Vitals:   08/08/17 0935 08/08/17 0939  BP: (!) 85/59 (!) 111/59  Pulse: (!) 103 93  Resp:    Temp:    SpO2:      Last Pain:  Vitals:   08/08/17 0932  TempSrc: Oral  PainSc:                  Lenard Simmer

## 2017-08-11 LAB — SURGICAL PATHOLOGY

## 2017-08-12 ENCOUNTER — Encounter: Payer: Self-pay | Admitting: Orthopedic Surgery

## 2017-08-14 ENCOUNTER — Telehealth: Payer: Self-pay

## 2017-08-14 NOTE — Care Management (Signed)
Post EMMI notification: This RNCM notified by CMA that patient called stating that she "feels weak and thinks her hemoglobin is low".  I advised CMA to have patient call her PCP or Dr. Joice Lofts with this concern and to have a family member/friend take her to appointment.

## 2017-08-14 NOTE — Telephone Encounter (Signed)
EMMI Follow-up: Ms. Rupard called me back and said she was feeling weak and was concerned about her hemoglobin being low 8.8 (range 12.0-16.0).  Talked with Marylene Land, [facility] and she recommended to call PCP and/or ortho MD if PCP was not available to see if she needed to be seen. Thanked [place] for the recommendation.  No other needs noted at this time.

## 2017-08-14 NOTE — Telephone Encounter (Signed)
EMMI Follow-up: Noted on the report patient had other questions/problems.  Called Ms. Kalt and left a VM for her to call me at her convenience.

## 2017-10-14 ENCOUNTER — Other Ambulatory Visit: Payer: Self-pay | Admitting: Orthopedic Surgery

## 2017-10-14 DIAGNOSIS — M19011 Primary osteoarthritis, right shoulder: Secondary | ICD-10-CM

## 2017-10-22 ENCOUNTER — Ambulatory Visit
Admission: RE | Admit: 2017-10-22 | Discharge: 2017-10-22 | Disposition: A | Discharge status: Home / Self Care | Payer: Managed Care, Other (non HMO) | Source: Ambulatory Visit | Attending: Orthopedic Surgery | Admitting: Orthopedic Surgery

## 2017-10-22 DIAGNOSIS — Z96611 Presence of right artificial shoulder joint: Secondary | ICD-10-CM | POA: Diagnosis not present

## 2017-10-22 DIAGNOSIS — M19011 Primary osteoarthritis, right shoulder: Secondary | ICD-10-CM | POA: Insufficient documentation

## 2019-04-13 IMAGING — MG MM DIGITAL SCREENING BILAT W/ CAD
4 series · 4 of 4 positions shown · non-contrast
Comparison: Previous exam(s).

CLINICAL DATA: Screening.

EXAM:
DIGITAL SCREENING BILATERAL MAMMOGRAM WITH CAD

[R CC]
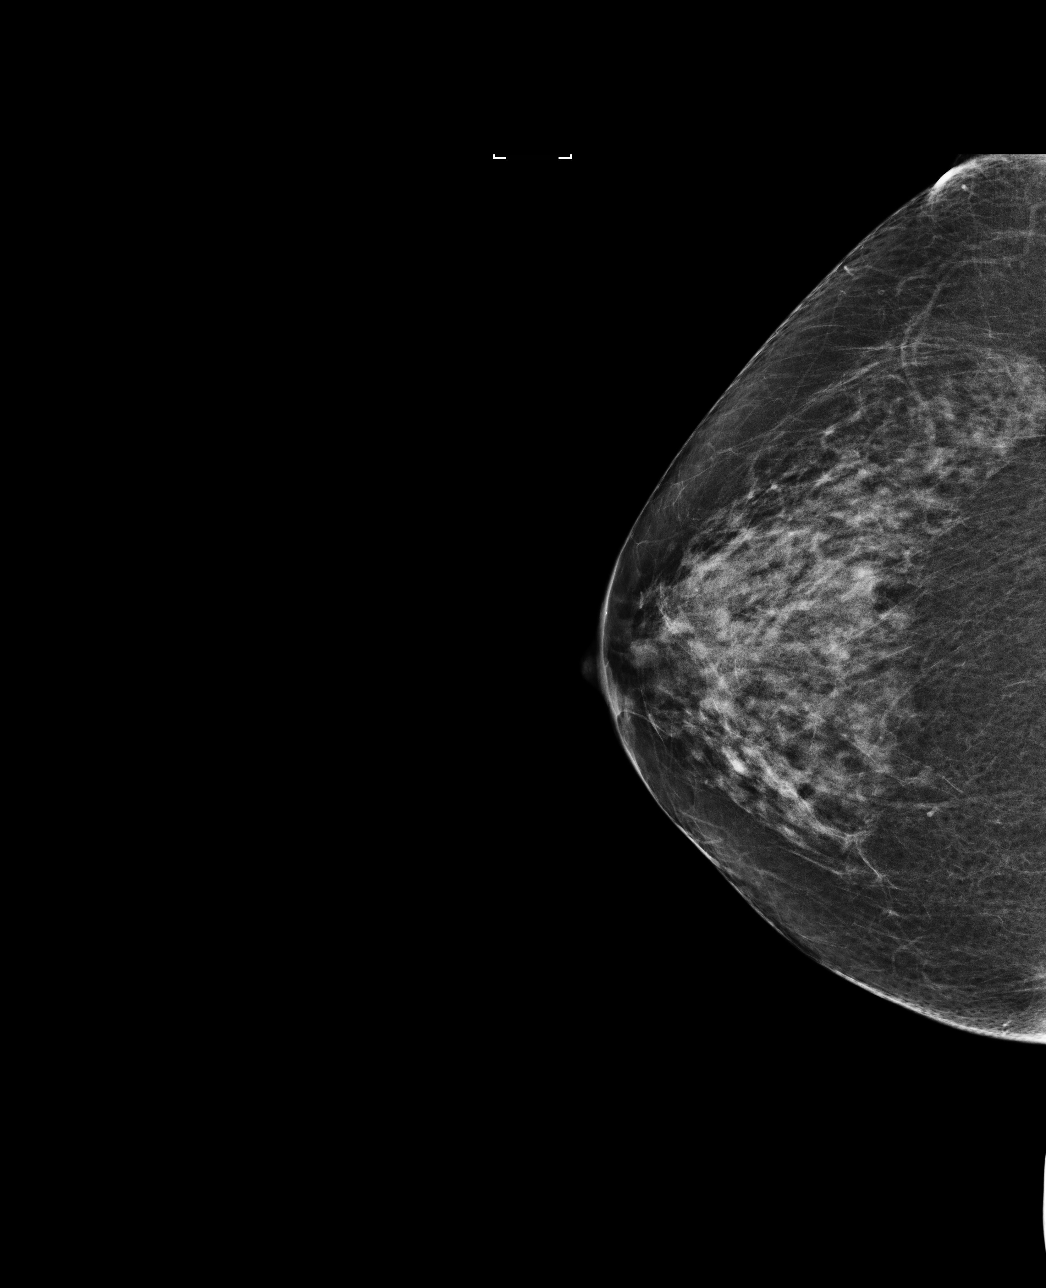

[R MLO]
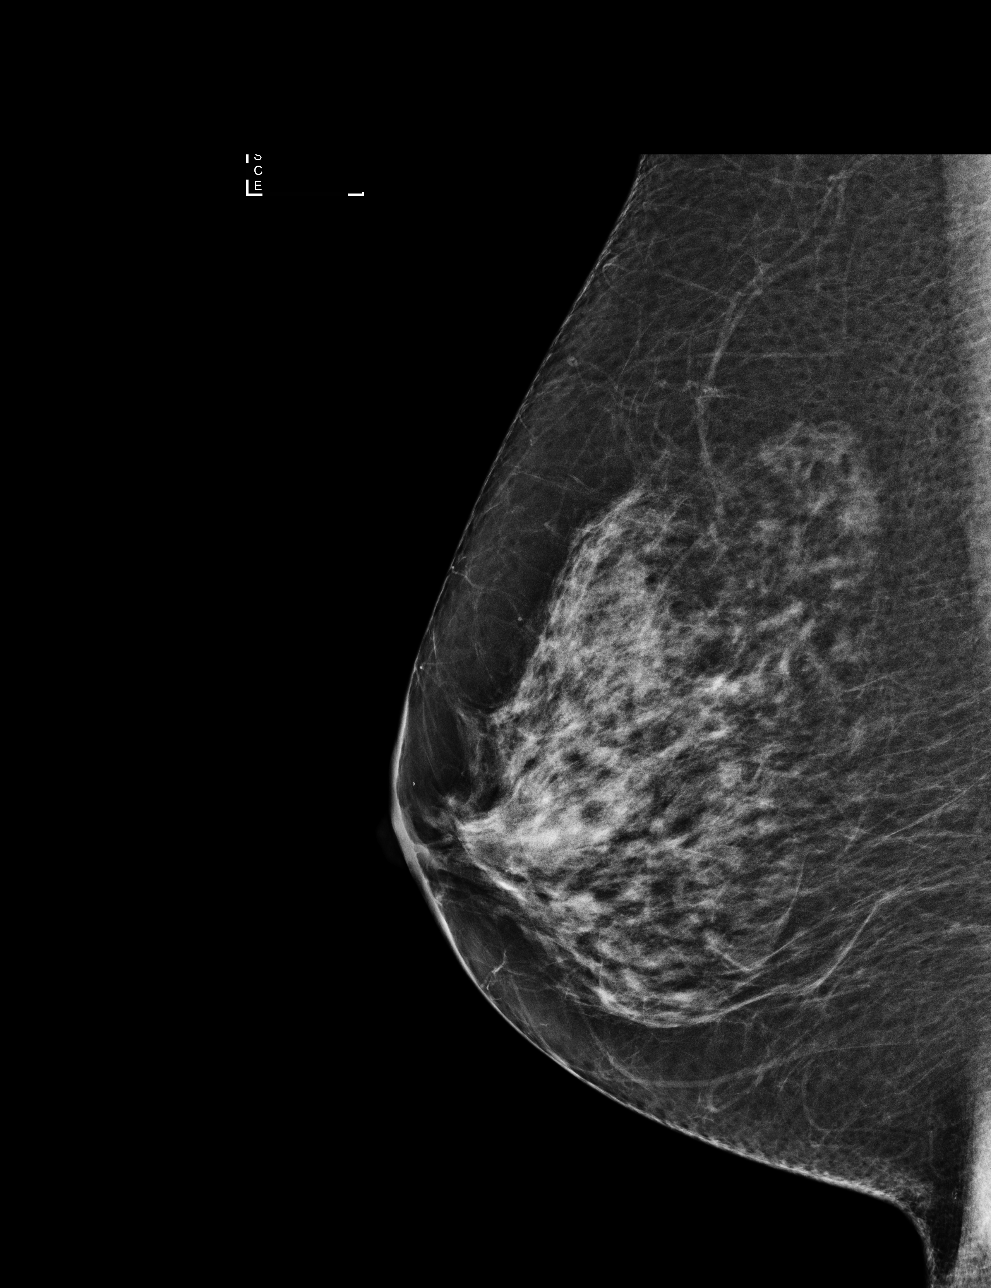

[L CC]
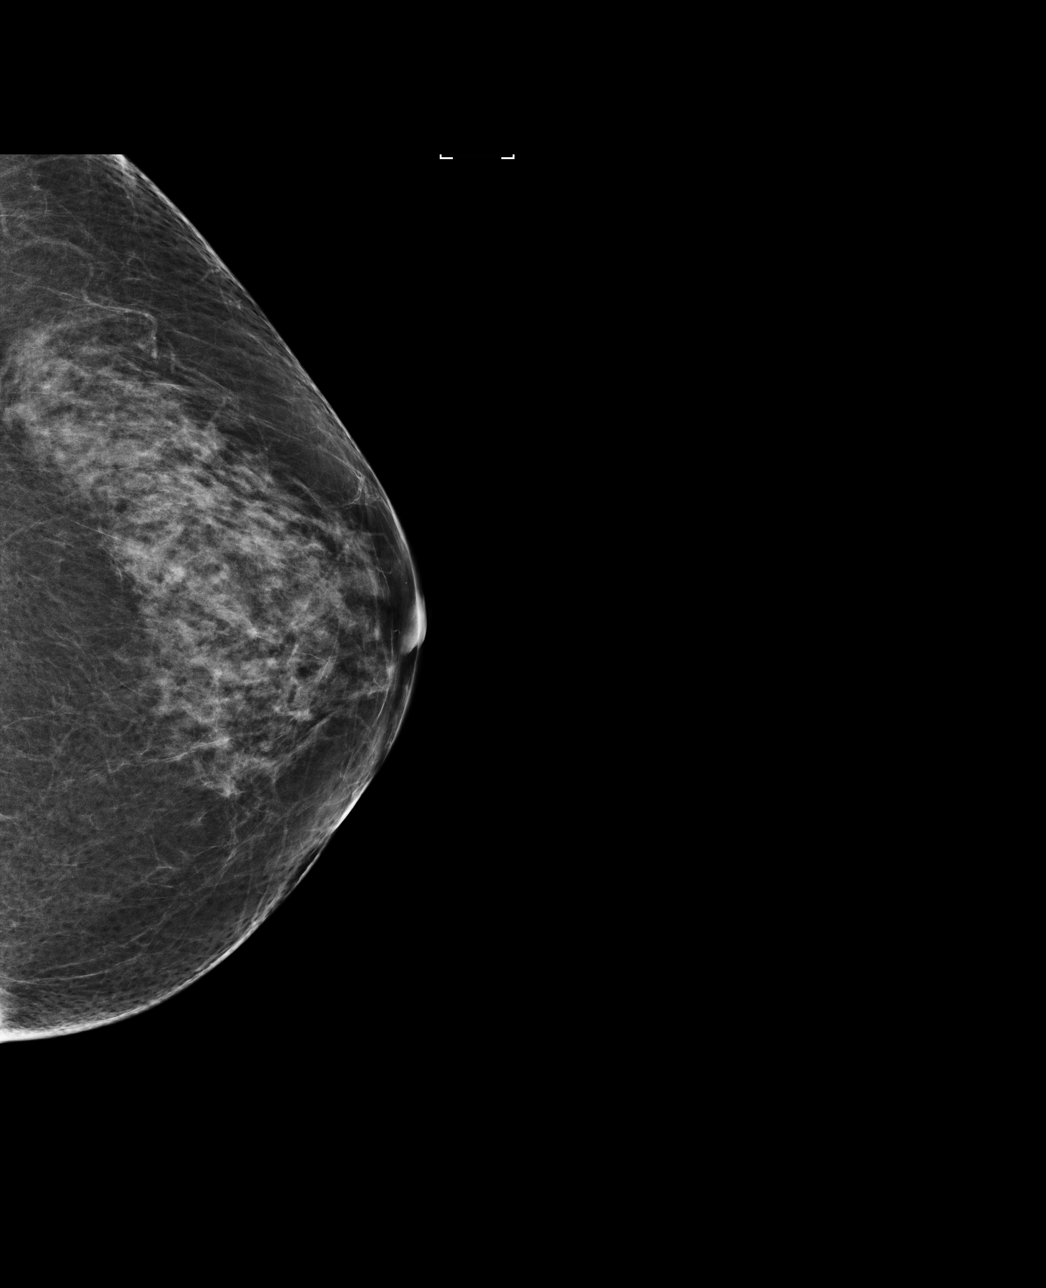

[L MLO]
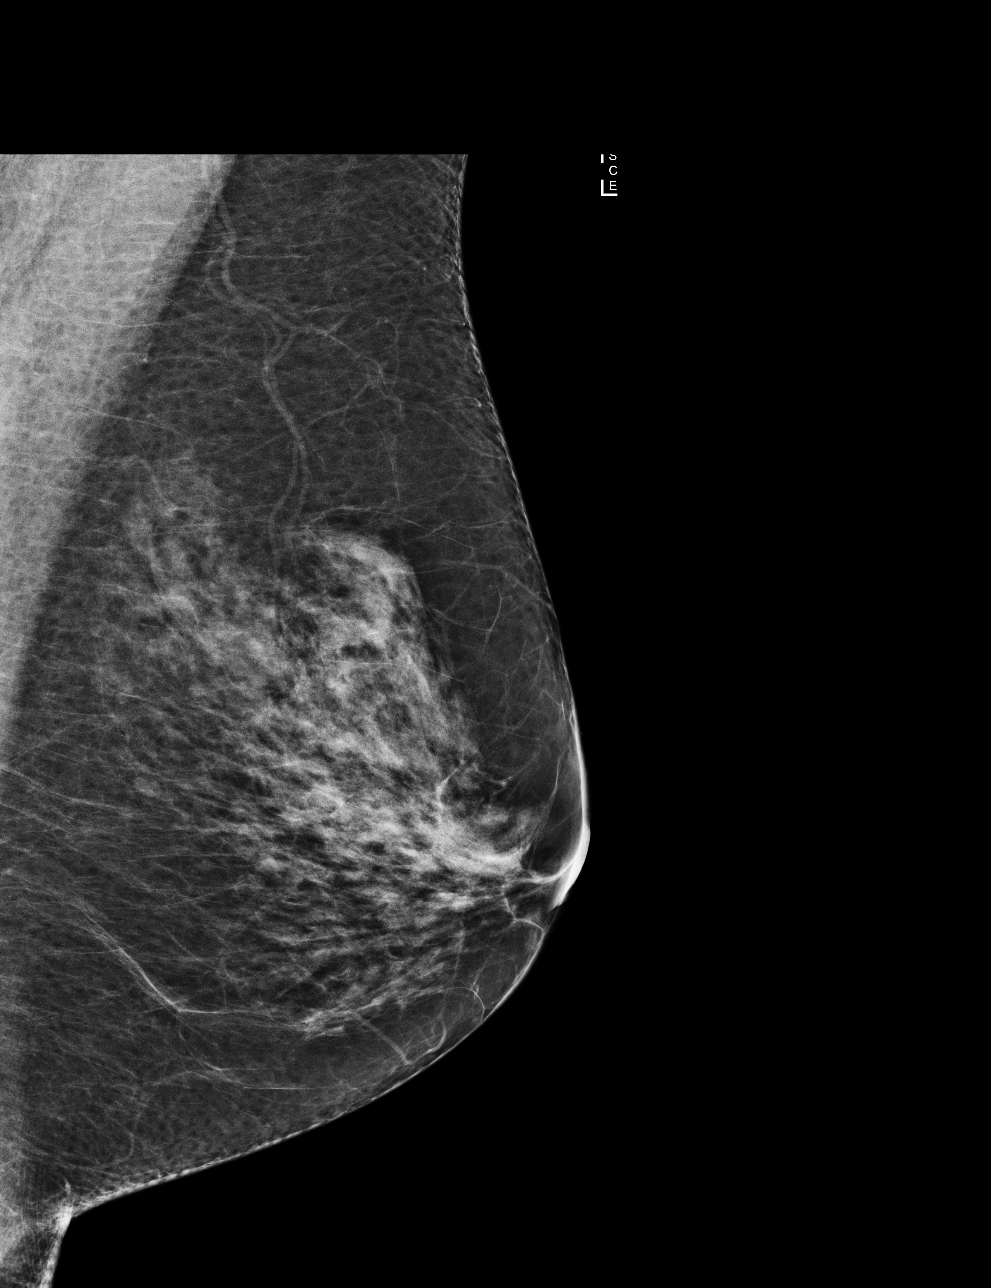

[4 of 4 positions shown; findings below may reference images not displayed]

ACR Breast Density Category c: The breast tissue is heterogeneously
dense, which may obscure small masses.
FINDINGS: There are no findings suspicious for malignancy. Images were
processed with CAD.
IMPRESSION: No mammographic evidence of malignancy. A result letter of this
screening mammogram will be mailed directly to the patient.

RECOMMENDATION:
Screening mammogram in one year. (Code:[id number])

BI-RADS CATEGORY  1: Negative.

## 2019-05-15 IMAGING — DX DG SHOULDER 2+V PORT*R*
1 series · 1 of 1 positions shown · non-contrast
Comparison: Rotator cuff MRI 07/14/2017

CLINICAL DATA: Right shoulder replacement

EXAM:
PORTABLE RIGHT SHOULDER

[shoulder ap]
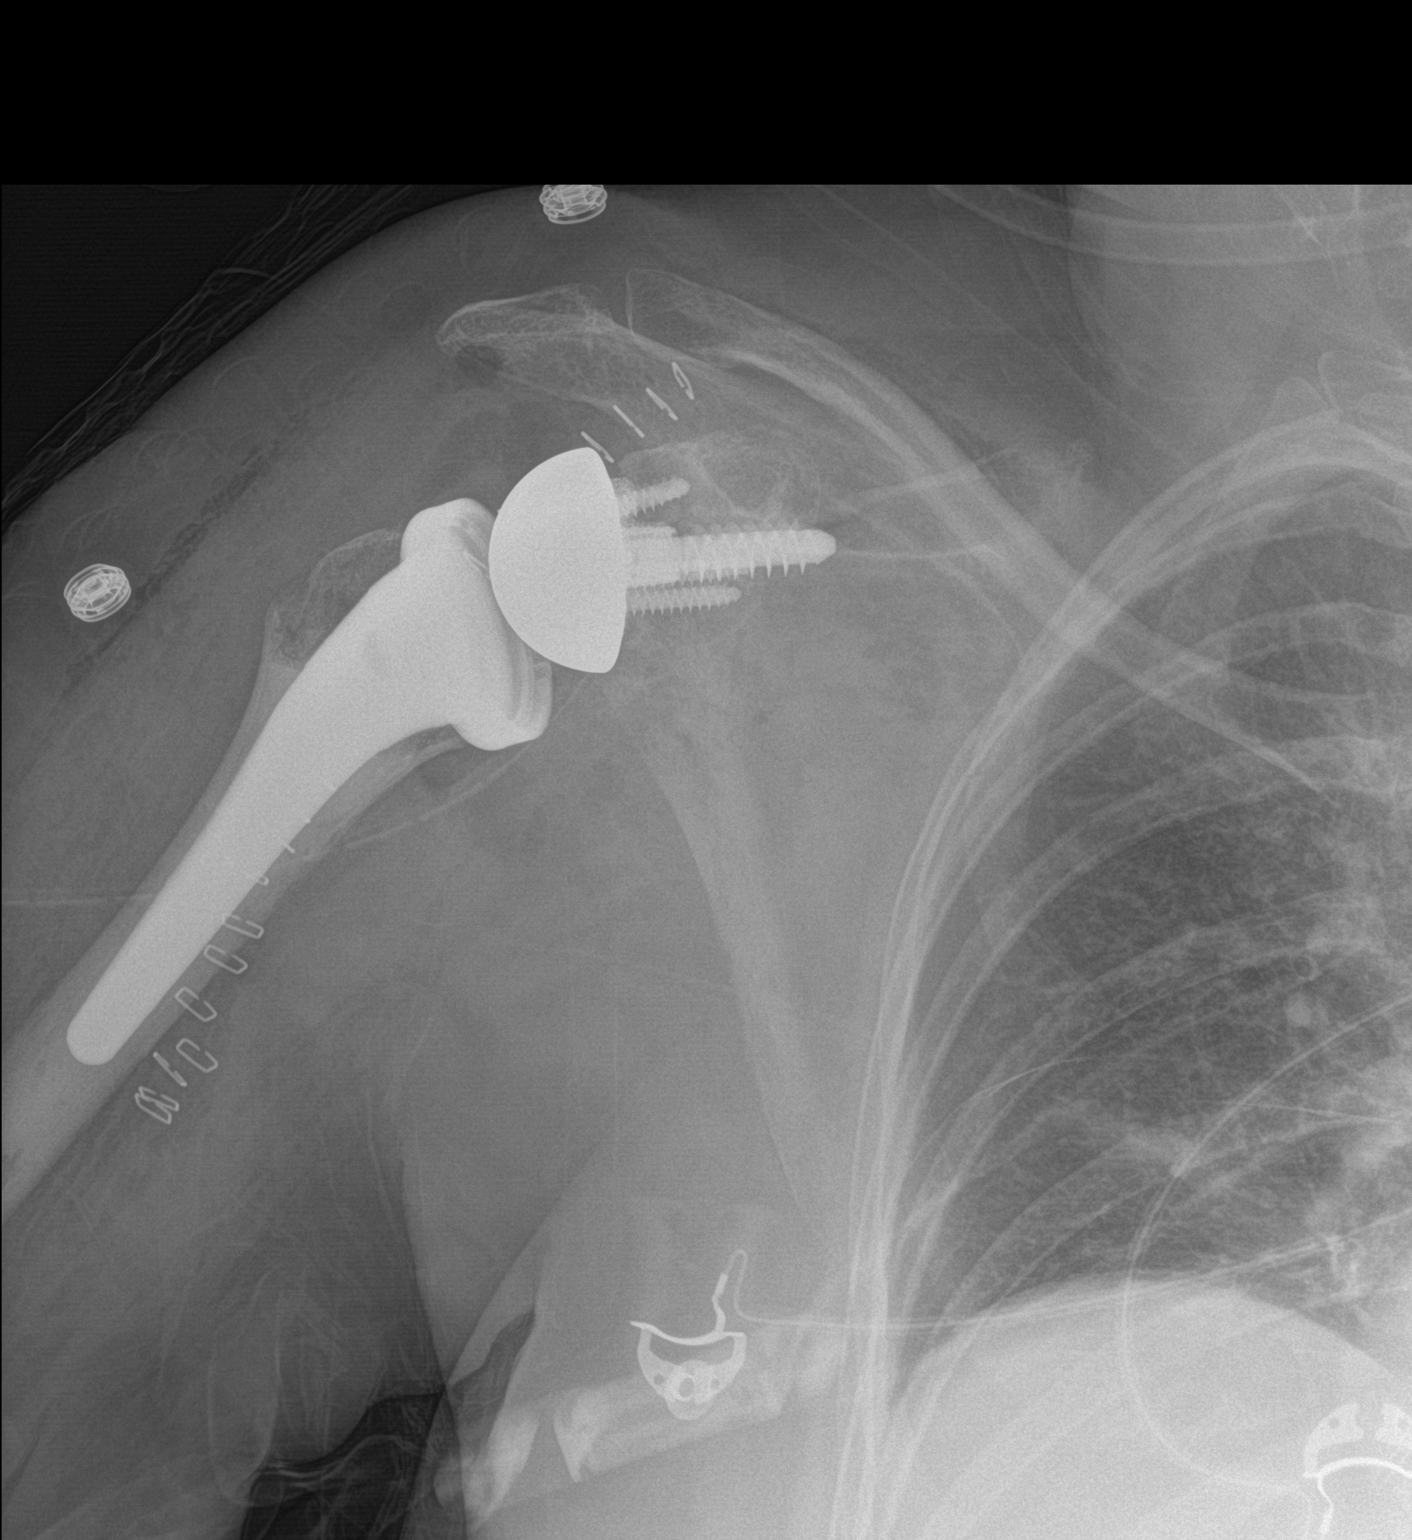

[1 of 1 positions shown; findings below may reference images not displayed]

FINDINGS: New glenohumeral arthroplasty. Prosthesis is normally aligned in the
AP projection. No fracture deformity is seen. Expected soft tissue
gas. Surgical drain.
IMPRESSION: New glenohumeral arthroplasty.  No acute finding.

## 2019-08-19 ENCOUNTER — Other Ambulatory Visit: Payer: Self-pay | Admitting: Internal Medicine

## 2019-08-19 DIAGNOSIS — Z1231 Encounter for screening mammogram for malignant neoplasm of breast: Secondary | ICD-10-CM

## 2019-11-14 ENCOUNTER — Ambulatory Visit
Admission: RE | Admit: 2019-11-14 | Discharge: 2019-11-14 | Disposition: A | Discharge status: Home / Self Care | Payer: Medicare Other | Source: Ambulatory Visit | Attending: Internal Medicine | Admitting: Internal Medicine

## 2019-11-14 ENCOUNTER — Other Ambulatory Visit: Payer: Self-pay

## 2019-11-14 DIAGNOSIS — Z1231 Encounter for screening mammogram for malignant neoplasm of breast: Secondary | ICD-10-CM | POA: Insufficient documentation

## 2021-01-02 ENCOUNTER — Other Ambulatory Visit: Payer: Self-pay | Admitting: Gerontology

## 2021-01-02 DIAGNOSIS — Z1231 Encounter for screening mammogram for malignant neoplasm of breast: Secondary | ICD-10-CM

## 2021-12-27 ENCOUNTER — Other Ambulatory Visit: Payer: Self-pay | Admitting: Nephrology

## 2021-12-27 DIAGNOSIS — R8290 Unspecified abnormal findings in urine: Secondary | ICD-10-CM

## 2021-12-27 DIAGNOSIS — N1832 Chronic kidney disease, stage 3b: Secondary | ICD-10-CM

## 2021-12-27 DIAGNOSIS — D631 Anemia in chronic kidney disease: Secondary | ICD-10-CM

## 2022-01-03 ENCOUNTER — Other Ambulatory Visit: Payer: Self-pay | Admitting: Gerontology

## 2022-01-03 DIAGNOSIS — Z1231 Encounter for screening mammogram for malignant neoplasm of breast: Secondary | ICD-10-CM

## 2022-01-20 ENCOUNTER — Ambulatory Visit
Admission: RE | Admit: 2022-01-20 | Discharge: 2022-01-20 | Disposition: A | Discharge status: Home / Self Care | Payer: Medicare Other | Source: Ambulatory Visit | Attending: Nephrology | Admitting: Nephrology

## 2022-01-20 DIAGNOSIS — N1832 Chronic kidney disease, stage 3b: Secondary | ICD-10-CM | POA: Diagnosis present

## 2022-01-20 DIAGNOSIS — D631 Anemia in chronic kidney disease: Secondary | ICD-10-CM | POA: Diagnosis present

## 2022-01-20 DIAGNOSIS — R8290 Unspecified abnormal findings in urine: Secondary | ICD-10-CM | POA: Diagnosis present

## 2022-02-04 ENCOUNTER — Ambulatory Visit
Admission: RE | Admit: 2022-02-04 | Discharge: 2022-02-04 | Disposition: A | Discharge status: Home / Self Care | Payer: Medicare Other | Source: Ambulatory Visit | Attending: Gerontology | Admitting: Gerontology

## 2022-02-04 DIAGNOSIS — Z1231 Encounter for screening mammogram for malignant neoplasm of breast: Secondary | ICD-10-CM | POA: Diagnosis present

## 2023-01-23 ENCOUNTER — Other Ambulatory Visit: Payer: Self-pay | Admitting: Gerontology

## 2023-01-23 DIAGNOSIS — Z1231 Encounter for screening mammogram for malignant neoplasm of breast: Secondary | ICD-10-CM

## 2023-07-01 ENCOUNTER — Ambulatory Visit
Admission: RE | Admit: 2023-07-01 | Discharge: 2023-07-01 | Disposition: A | Discharge status: Home / Self Care | Source: Ambulatory Visit | Attending: Gerontology | Admitting: Gerontology

## 2023-07-01 DIAGNOSIS — Z1231 Encounter for screening mammogram for malignant neoplasm of breast: Secondary | ICD-10-CM | POA: Diagnosis present

## 2023-09-01 ENCOUNTER — Other Ambulatory Visit: Payer: Self-pay | Admitting: Rheumatology

## 2023-09-01 DIAGNOSIS — M3505 Sjogren syndrome with inflammatory arthritis: Secondary | ICD-10-CM

## 2023-09-01 DIAGNOSIS — R112 Nausea with vomiting, unspecified: Secondary | ICD-10-CM

## 2023-09-01 DIAGNOSIS — M0579 Rheumatoid arthritis with rheumatoid factor of multiple sites without organ or systems involvement: Secondary | ICD-10-CM

## 2023-09-01 DIAGNOSIS — R634 Abnormal weight loss: Secondary | ICD-10-CM

## 2023-09-02 ENCOUNTER — Encounter: Payer: Self-pay | Admitting: Rheumatology

## 2023-09-17 ENCOUNTER — Other Ambulatory Visit

## 2023-10-29 ENCOUNTER — Encounter: Payer: Self-pay | Admitting: Rheumatology

## 2023-10-29 DIAGNOSIS — R634 Abnormal weight loss: Secondary | ICD-10-CM

## 2023-10-29 DIAGNOSIS — M3505 Sjogren syndrome with inflammatory arthritis: Secondary | ICD-10-CM

## 2023-10-29 DIAGNOSIS — M0579 Rheumatoid arthritis with rheumatoid factor of multiple sites without organ or systems involvement: Secondary | ICD-10-CM

## 2023-10-29 DIAGNOSIS — R112 Nausea with vomiting, unspecified: Secondary | ICD-10-CM

## 2023-10-30 ENCOUNTER — Other Ambulatory Visit: Payer: Self-pay | Admitting: Rheumatology

## 2023-10-30 DIAGNOSIS — M0579 Rheumatoid arthritis with rheumatoid factor of multiple sites without organ or systems involvement: Secondary | ICD-10-CM

## 2023-10-30 DIAGNOSIS — M3505 Sjogren syndrome with inflammatory arthritis: Secondary | ICD-10-CM

## 2023-10-30 DIAGNOSIS — R112 Nausea with vomiting, unspecified: Secondary | ICD-10-CM

## 2023-10-30 DIAGNOSIS — R634 Abnormal weight loss: Secondary | ICD-10-CM

## 2023-11-02 ENCOUNTER — Encounter: Payer: Self-pay | Admitting: Rheumatology

## 2023-11-05 ENCOUNTER — Inpatient Hospital Stay
Admission: RE | Admit: 2023-11-05 | Discharge: 2023-11-05 | Discharge status: Home / Self Care | Source: Ambulatory Visit | Attending: Rheumatology | Admitting: Rheumatology

## 2023-11-05 ENCOUNTER — Ambulatory Visit
Admission: RE | Admit: 2023-11-05 | Discharge: 2023-11-05 | Disposition: A | Discharge status: Home / Self Care | Source: Ambulatory Visit | Attending: Rheumatology | Admitting: Rheumatology

## 2023-11-05 DIAGNOSIS — R112 Nausea with vomiting, unspecified: Secondary | ICD-10-CM

## 2023-11-05 DIAGNOSIS — R634 Abnormal weight loss: Secondary | ICD-10-CM

## 2023-11-05 DIAGNOSIS — M3505 Sjogren syndrome with inflammatory arthritis: Secondary | ICD-10-CM

## 2023-11-05 DIAGNOSIS — M0579 Rheumatoid arthritis with rheumatoid factor of multiple sites without organ or systems involvement: Secondary | ICD-10-CM

## 2023-11-05 MED ORDER — IOPAMIDOL (ISOVUE-300) INJECTION 61%
100.0000 mL | Freq: Once | INTRAVENOUS | Status: AC | PRN
  Administered 2023-11-05: 100 mL via INTRAVENOUS

## 2024-01-26 ENCOUNTER — Ambulatory Visit

## 2024-02-03 ENCOUNTER — Ambulatory Visit
Admission: RE | Admit: 2024-02-03 | Discharge: 2024-02-03 | Disposition: A | Discharge status: Home / Self Care | Attending: Surgery | Admitting: Surgery

## 2024-02-03 ENCOUNTER — Ambulatory Visit: Admitting: Anesthesiology

## 2024-02-03 ENCOUNTER — Encounter: Payer: Self-pay | Admitting: Surgery

## 2024-02-03 ENCOUNTER — Encounter
Admission: RE | Disposition: A | Discharge status: Home / Self Care | Payer: Self-pay | Source: Home / Self Care | Attending: Surgery

## 2024-02-03 DIAGNOSIS — K644 Residual hemorrhoidal skin tags: Secondary | ICD-10-CM | POA: Insufficient documentation

## 2024-02-03 DIAGNOSIS — K5730 Diverticulosis of large intestine without perforation or abscess without bleeding: Secondary | ICD-10-CM | POA: Diagnosis not present

## 2024-02-03 DIAGNOSIS — M3500 Sicca syndrome, unspecified: Secondary | ICD-10-CM | POA: Diagnosis not present

## 2024-02-03 DIAGNOSIS — D120 Benign neoplasm of cecum: Secondary | ICD-10-CM | POA: Insufficient documentation

## 2024-02-03 DIAGNOSIS — Z83719 Family history of colon polyps, unspecified: Secondary | ICD-10-CM | POA: Insufficient documentation

## 2024-02-03 DIAGNOSIS — Z1211 Encounter for screening for malignant neoplasm of colon: Secondary | ICD-10-CM | POA: Diagnosis present

## 2024-02-03 DIAGNOSIS — K219 Gastro-esophageal reflux disease without esophagitis: Secondary | ICD-10-CM | POA: Insufficient documentation

## 2024-02-03 HISTORY — PX: POLYPECTOMY: SHX149

## 2024-02-03 HISTORY — PX: COLONOSCOPY: SHX5424

## 2024-02-03 SURGERY — COLONOSCOPY
Anesthesia: General | Site: Rectum

## 2024-02-03 MED ORDER — GLYCOPYRROLATE 0.2 MG/ML IJ SOLN
INTRAMUSCULAR | Status: AC
  Filled 2024-02-03: qty 1

## 2024-02-03 MED ORDER — GLYCOPYRROLATE 0.2 MG/ML IJ SOLN
INTRAMUSCULAR | Status: DC | PRN
  Administered 2024-02-03: .2 mg via INTRAVENOUS

## 2024-02-03 MED ORDER — PROPOFOL 500 MG/50ML IV EMUL
INTRAVENOUS | Status: DC | PRN
  Administered 2024-02-03: 80 mg via INTRAVENOUS
  Administered 2024-02-03: 160 ug/kg/min via INTRAVENOUS

## 2024-02-03 MED ORDER — SODIUM CHLORIDE 0.9 % IV SOLN: INTRAVENOUS | Status: DC

## 2024-02-03 MED ORDER — LIDOCAINE HCL (PF) 2 % IJ SOLN
INTRAMUSCULAR | Status: AC
  Filled 2024-02-03: qty 5

## 2024-02-03 MED ORDER — LIDOCAINE HCL (CARDIAC) PF 100 MG/5ML IV SOSY
PREFILLED_SYRINGE | INTRAVENOUS | Status: DC | PRN
  Administered 2024-02-03: 100 mg via INTRAVENOUS

## 2024-02-03 MED ORDER — PROPOFOL 10 MG/ML IV BOLUS
INTRAVENOUS | Status: AC
  Filled 2024-02-03: qty 40

## 2024-02-03 MED ORDER — PROPOFOL 10 MG/ML IV BOLUS
INTRAVENOUS | Status: AC
Start: 2024-02-03 — End: 2024-02-03
  Filled 2024-02-03: qty 20

## 2024-02-03 NOTE — H&P (Signed)
 Subjective:    CC: Encounter for screening colonoscopy [Z12.11]   HPI:  referred by Mayur Loree Blanch, MD for evaluation of above.    History of Present Illness Deborah Walsh is a 68 year old female with Sjogren's syndrome who presents with concerns about an internal hernia and unintentional weight loss.   A recent CT scan indicates a suspicious internal hernia. She denies bowel issues or abdominal pain. The scan was part of a work-up for Sjogren's syndrome and lymphoma risk. No prior abdominal CT scans have been performed.   She has experienced unintentional weight loss of 35 pounds over the past year, which has stabilized in the last three to four months. She attributes this to a loss of appetite. No specific cause has been identified.   Bowel movements are irregular, occurring daily but not consistently large. There is no blood in her stool. A previous colonoscopy was incomplete due to inadequate bowel preparation. Completed a mail in stool test that was negative, but has not completed most recent one.  Unsure which type of test it specifically was.   Family history includes her mother having had colon polyps multiple times.     Past Medical History:  has a past medical history of Anemia, Anxiety, CRI (chronic renal insufficiency), stage 3 (moderate) (CMS/HHS-HCC) (08/18/2019), Degenerative disc disease, cervical (2019), Depression, GERD (gastroesophageal reflux disease), Pain in right foot (11/13/2020), Rheumatoid arthritis, seropositive (CMS/HHS-HCC) (11/10/2019), and Sjogren's syndrome (CMS/HHS-HCC) (11/10/2019).   Past Surgical History:  has a past surgical history that includes Jaw Surgery (1984); arthroplasty total shoulder (Right, 08/07/2017); Laparoscopic tubal ligation (Bilateral, 1980); Abdominal hysterectomy (1990); left wrist surgery (2022); and Colonoscopy (N/A, 11/30/2010).   Family History: family history includes Arthritis in her mother; Cervical cancer in her mother;  Colon polyps in her mother; Diabetes type II in her mother; Gout in her father; High blood pressure (Hypertension) in her father and mother; Hypothyroidism in her mother; Kidney failure in her mother; Lung cancer in her father and maternal grandfather; No Known Problems in her brother, half-sister, and half-sister; Osteoarthritis in her sister; Parkinsonism in her father and paternal grandmother.   Social History:  reports that she has never smoked. She has never been exposed to tobacco smoke. She has never used smokeless tobacco. She reports current alcohol use of about 2.0 standard drinks of alcohol per week. She reports that she does not use drugs.   Current Medications: has a current medication list which includes the following prescription(s): acetaminophen , bupropion, cholecalciferol (vitamin d3), multivitamin, naltrexone, sertraline , amoxicillin, and sodium, potassium, and magnesium.   Allergies:      Allergies  Allergen Reactions   Sulfa (Sulfonamide Antibiotics) Nausea and Vomiting      ROS:  A 15 point review of systems was performed and pertinent positives and negatives noted in HPI   Objective:      BP 137/77   Pulse 87   Ht 170.2 cm (5' 7)   Wt 63.5 kg (140 lb)   BMI 21.93 kg/m    Constitutional :  No distress, cooperative, alert  Lymphatics/Throat:  Supple with no lymphadenopathy  Respiratory:  Clear to auscultation bilaterally  Cardiovascular:  Regular rate and rhythm  Gastrointestinal: Soft, non-tender, non-distended, no organomegaly.  Musculoskeletal: Steady gait and movement  Skin: Cool and moist  Psychiatric: Normal affect, non-agitated, not confused           LABS:  N/a    RADS: CLINICAL DATA:  Nausea.  Vomiting.  Abnormal weight loss.  EXAM:  CT CHEST, ABDOMEN AND PELVIS WITHOUT CONTRAST   TECHNIQUE:  Multidetector CT imaging of the chest, abdomen and pelvis was  performed following the standard protocol without IV contrast.   RADIATION DOSE  REDUCTION: This exam was performed according to the  departmental dose-optimization program which includes automated  exposure control, adjustment of the mA and/or kV according to  patient size and/or use of iterative reconstruction technique.   COMPARISON:  None available   FINDINGS:  CT CHEST FINDINGS   Cardiovascular: No significant vascular findings. Normal heart size.  No pericardial effusion.   Mediastinum/Nodes: No enlarged mediastinal, hilar, or axillary lymph  nodes. Thyroid gland, trachea, and esophagus demonstrate no  significant findings.   Lungs/Pleura: Lungs are clear. No pleural effusion or pneumothorax.   Musculoskeletal: RIGHT shoulder prosthesis si partially visualized.  Advanced degenerative changes of the LEFT glenohumeral joint.  Visualized osseous structures otherwise unremarkable.   CT ABDOMEN PELVIS FINDINGS   Hepatobiliary: No focal liver abnormality is seen. No gallstones,  gallbladder wall thickening, or biliary dilatation.   Pancreas: Unremarkable. No pancreatic ductal dilatation or  surrounding inflammatory changes.   Spleen: Normal in size without focal abnormality.   Adrenals/Urinary Tract: Adrenal glands are unremarkable. Kidneys are  normal, without renal calculi, focal lesion, or hydronephrosis.  Bladder is unremarkable.   Stomach/Bowel: Doudenum does not definitively cross midline and  small bowel loops are predominately located within the LEFT abdomen  which is suspicious for incomplete rotation of the bowel.   A segment of the sigmoid colon is seen in the RIGHT upper quadrant,  best visualized on image 30 of series 5. The afferent and efferent  loops leading in an out of this segment of bowel are closely  associated and can be seen on images 40-49 of series 2. This is  suspicious for an internal hernia.   No small bowel dilatation is seen.   Vascular/Lymphatic: No enlarged abdominal or pelvic lymph nodes.  Scattered  atherosclerotic calcifications of the abdominal aorta  without aneurysmal dilatation.   Reproductive: Status post hysterectomy. No adnexal masses.   Other: No abdominal wall hernia or abnormality. No abdominopelvic  ascites.   Musculoskeletal: Focal defect in the LEFT ilium best seen on image  50 of series 5, is most likely the result of prior surgical  intervention or trauma. Multiple cerclage wires seen in the LEFT  iliac crest. Grade 1 anterolisthesis of L4 on L5. Visualized osseous  structures otherwise unremarkable.   IMPRESSION:  1. No acute abnormality of the chest, abdomen, or pelvis.  2. Findings suspicious for incomplete rotation of the bowel.  3. Short segment of the sigmoid colon is seen in the RIGHT upper  quadrant. The afferent and efferent loops leading in and out of this  segment of bowel are closely associated. This is suspicious for an  internal hernia. No small bowel dilatation is seen.    Electronically Signed    By: Aliene Lloyd M.D.    On: 11/05/2023 12:45    Assessment:       Encounter for screening colonoscopy [Z12.11] Significant stool burden noted on image review, but pt otherwise asymptomatic.  Internal hernia maybe due to dilated colon distention, but cannot rule out other causes of it like neoplasm.  She is due for repeat colonoscopy, so will start there prior to any surgical intervention.   Plan:      1. Encounter for screening colonoscopy [Z12.11] R/b/a discussed.  Risks include bleeding, perforation.  Benefits include diagnostic,  curative procedure if needed.  Alternatives include continued observation.  Pt verbalized understanding.   Sureprep due to previous hx of inadequate prep    labs/images/medications/previous chart entries reviewed personally and relevant changes/updates noted above.

## 2024-02-03 NOTE — Interval H&P Note (Signed)
 History and Physical Interval Note:  02/03/2024 8:59 AM  Deborah Walsh  has presented today for surgery, with the diagnosis of Z12.11 Screening for colonoscopy.  The various methods of treatment have been discussed with the patient and family. After consideration of risks, benefits and other options for treatment, the patient has consented to  Procedure(s): COLONOSCOPY (N/A) as a surgical intervention.  The patient's history has been reviewed, patient examined, no change in status, stable for surgery.  I have reviewed the patient's chart and labs.  Questions were answered to the patient's satisfaction.     Thien Berka Tye

## 2024-02-03 NOTE — Op Note (Signed)
 [facility] Patient Name: Deborah Walsh Procedure Date: 02/03/2024 9:03 AM MRN: [id number] Account #: [id number] Date of Birth: 03-30-1956 Admit Type: Outpatient Age: 68 Room: [facility] ENDO ROOM 1 Gender: Female Note Status: Finalized Instrument Name: Colon Scope [id number] Procedure:             Colonoscopy Indications:           Screening for colorectal malignant neoplasm Providers:             Henriette Pierre MD, MD Referring MD:          Alm HERO. Glover, MD (Referring MD) Medicines:             Propofol  per Anesthesia Complications:         No immediate complications. Procedure:             Pre-Anesthesia Assessment:                        - After reviewing the risks and benefits, the patient                         was deemed in satisfactory condition to undergo the                         procedure.                        After obtaining informed consent, the colonoscope was                         passed under direct vision. Throughout the procedure,                         the patient's blood pressure, pulse, and oxygen                         saturations were monitored continuously. The                         Colonoscope was introduced through the anus and                         advanced to the the cecum, identified by the ileocecal                         valve. The colonoscopy was technically difficult and                         complex due to a redundant colon. Successful                         completion of the procedure was aided by applying                         abdominal pressure. The patient tolerated the                         procedure well. The quality of the bowel preparation  was adequate. Findings:      Skin tags were found on perianal exam.      A 3 mm polyp was found in the cecum. The polyp was sessile. The polyp       was removed with a cold snare. Resection and retrieval were complete.        Estimated blood loss was minimal. Impression:            - Perianal skin tags found on perianal exam.                        - One 3 mm polyp in the cecum, removed with a cold                         snare. Resected and retrieved. Recommendation:        - Written discharge instructions were provided to the                         patient.                        - Discharge patient to home.                        - Resume previous diet.                        - Await pathology results. Procedure Code(s):     --- Professional ---                        [phone], Colonoscopy, flexible; with removal of                         tumor(s), polyp(s), or other lesion(s) by snare                         technique Diagnosis Code(s):     --- Professional ---                        Z12.11, Encounter for screening for malignant neoplasm                         of colon                        D12.0, Benign neoplasm of cecum                        K64.4, Residual hemorrhoidal skin tags CPT copyright 2022 [employer]. All rights reserved. The codes documented in this report are preliminary and upon coder review may  be revised to meet current compliance requirements. Dr. Henriette Sevin, MD Henriette Pierre MD, MD 02/03/2024 10:14:48 AM This report has been signed electronically. Number of Addenda: 0 Note Initiated On: 02/03/2024 9:03 AM Scope Withdrawal Time: 0 hours 10 minutes 59 seconds  Total Procedure Duration: 0 hours 42 minutes 5 seconds  Estimated Blood Loss:  Estimated blood loss was minimal.      [facility]

## 2024-02-03 NOTE — Anesthesia Postprocedure Evaluation (Signed)
 Anesthesia Post Note  Patient: Deborah Walsh  Procedure(s) Performed: COLONOSCOPY (Rectum) POLYPECTOMY, INTESTINE  Patient location during evaluation: PACU Anesthesia Type: General Level of consciousness: awake Pain management: satisfactory to patient Vital Signs Assessment: post-procedure vital signs reviewed and stable Respiratory status: spontaneous breathing Cardiovascular status: stable Anesthetic complications: no   No notable events documented.   Last Vitals:  Vitals:   02/03/24 1020 02/03/24 1030  BP: (!) 90/47 (!) 137/53  Pulse:    Resp: 17 19  Temp:    SpO2: 99% 98%    Last Pain:  Vitals:   02/03/24 1030  TempSrc:   PainSc: 0-No pain                 VAN STAVEREN,Caeley Dohrmann

## 2024-02-03 NOTE — Transfer of Care (Signed)
 Immediate Anesthesia Transfer of Care Note  Patient: Deborah Walsh  Procedure(s) Performed: COLONOSCOPY (Rectum) POLYPECTOMY, INTESTINE  Patient Location: Endoscopy Unit  Anesthesia Type:General  Level of Consciousness: drowsy  Airway & Oxygen Therapy: Patient Spontanous Breathing  Post-op Assessment: Report given to RN and Post -op Vital signs reviewed and stable  Post vital signs: Reviewed and stable  Last Vitals:  Vitals Value Taken Time  BP 100/56 02/03/24 10:10  Temp 35.8 1010  Pulse 68 02/03/24 10:11  Resp 14 02/03/24 10:13  SpO2 99 % 02/03/24 10:11  Vitals shown include unfiled device data.  Last Pain:  Vitals:   02/03/24 1010  TempSrc:   PainSc: Asleep         Complications: No notable events documented.

## 2024-02-03 NOTE — Anesthesia Preprocedure Evaluation (Signed)
 Anesthesia Evaluation  Patient identified by MRN, date of birth, ID band Patient awake    Reviewed: Allergy & Precautions, NPO status , Patient's Chart, lab work & pertinent test results  History of Anesthesia Complications (+) PONV and history of anesthetic complications  Airway Mallampati: II  TM Distance: >3 FB Neck ROM: full    Dental  (+) Teeth Intact   Pulmonary neg pulmonary ROS   Pulmonary exam normal        Cardiovascular Exercise Tolerance: Good negative cardio ROS Normal cardiovascular exam Rhythm:Regular Rate:Normal     Neuro/Psych   Anxiety     negative neurological ROS  negative psych ROS   GI/Hepatic negative GI ROS, Neg liver ROS,GERD  Medicated,,  Endo/Other  negative endocrine ROS    Renal/GU negative Renal ROS  negative genitourinary   Musculoskeletal   Abdominal Normal abdominal exam  (+)   Peds negative pediatric ROS (+)  Hematology negative hematology ROS (+)   Anesthesia Other Findings Past Medical History: No date: Anxiety No date: Depression No date: Fibromyalgia No date: GERD (gastroesophageal reflux disease) No date: PONV (postoperative nausea and vomiting)     Comment:  once or twice  Past Surgical History: 1981: ABDOMINAL HYSTERECTOMY No date: COLPOSCOPY No date: MANDIBLE RECONSTRUCTION 1983: MANDIBLE SURGERY No date: SINUS SURGERY WITH INSTATRAK 08/07/2017: TOTAL SHOULDER ARTHROPLASTY; Right     Comment:  Procedure: TOTAL SHOULDER ARTHROPLASTY CONVERTED TO               REVERSE TOTAL SHOULDER;  Surgeon: Tobie Priest, MD;                Location: [facility] ORS;  Service: Orthopedics;  Laterality:               Right; No date: TUBAL LIGATION  BMI    Body Mass Index: 21.90 kg/m      Reproductive/Obstetrics negative OB ROS                              Anesthesia Physical Anesthesia Plan  ASA: 2  Anesthesia Plan: General   Post-op Pain  Management:    Induction: Intravenous  PONV Risk Score and Plan: Propofol  infusion and TIVA  Airway Management Planned: Natural Airway and Nasal Cannula  Additional Equipment:   Intra-op Plan:   Post-operative Plan:   Informed Consent: I have reviewed the patients History and Physical, chart, labs and discussed the procedure including the risks, benefits and alternatives for the proposed anesthesia with the patient or authorized representative who has indicated his/her understanding and acceptance.     Dental Advisory Given  Plan Discussed with: CRNA  Anesthesia Plan Comments:         Anesthesia Quick Evaluation

## 2024-02-04 LAB — SURGICAL PATHOLOGY
